# Patient Record
Sex: Female | Born: 1980 | Hispanic: No | Marital: Single | Smoking: Never smoker
Health system: Southern US, Community
[De-identification: ages and names within clinical notes are randomized; demographics above are authoritative.]

## PROBLEM LIST (undated history)

## (undated) DIAGNOSIS — Z8742 Personal history of other diseases of the female genital tract: Secondary | ICD-10-CM

## (undated) DIAGNOSIS — N979 Female infertility, unspecified: Secondary | ICD-10-CM

## (undated) DIAGNOSIS — F411 Generalized anxiety disorder: Secondary | ICD-10-CM

## (undated) DIAGNOSIS — F32A Depression, unspecified: Secondary | ICD-10-CM

## (undated) DIAGNOSIS — R9389 Abnormal findings on diagnostic imaging of other specified body structures: Secondary | ICD-10-CM

## (undated) DIAGNOSIS — Z8719 Personal history of other diseases of the digestive system: Secondary | ICD-10-CM

## (undated) DIAGNOSIS — F419 Anxiety disorder, unspecified: Secondary | ICD-10-CM

## (undated) DIAGNOSIS — F329 Major depressive disorder, single episode, unspecified: Secondary | ICD-10-CM

## (undated) DIAGNOSIS — K589 Irritable bowel syndrome without diarrhea: Secondary | ICD-10-CM

## (undated) DIAGNOSIS — R87619 Unspecified abnormal cytological findings in specimens from cervix uteri: Secondary | ICD-10-CM

## (undated) HISTORY — PX: AUGMENTATION MAMMAPLASTY: SUR837

## (undated) HISTORY — DX: Depression, unspecified: F32.A

## (undated) HISTORY — DX: Major depressive disorder, single episode, unspecified: F32.9

## (undated) HISTORY — PX: CYST EXCISION: SHX5701

## (undated) HISTORY — DX: Personal history of other diseases of the digestive system: Z87.19

## (undated) HISTORY — PX: REFRACTIVE SURGERY: SHX103

## (undated) HISTORY — PX: BREAST SURGERY: SHX581

## (undated) HISTORY — DX: Unspecified abnormal cytological findings in specimens from cervix uteri: R87.619

## (undated) HISTORY — DX: Anxiety disorder, unspecified: F41.9

## (undated) HISTORY — PX: LAPAROSCOPIC CHOLECYSTECTOMY: SUR755

---

## 1999-08-09 HISTORY — PX: LAPAROSCOPIC CHOLECYSTECTOMY: SUR755

## 2004-09-08 HISTORY — PX: CYST EXCISION: SHX5701

## 2006-09-08 HISTORY — PX: AUGMENTATION MAMMAPLASTY: SUR837

## 2008-09-08 HISTORY — PX: CYST EXCISION: SHX5701

## 2012-02-03 LAB — HM PAP SMEAR: HM Pap smear: NEGATIVE

## 2013-02-03 ENCOUNTER — Encounter: Payer: Self-pay | Admitting: *Deleted

## 2013-02-04 ENCOUNTER — Encounter: Payer: Self-pay | Admitting: Nurse Practitioner

## 2013-02-04 ENCOUNTER — Ambulatory Visit (INDEPENDENT_AMBULATORY_CARE_PROVIDER_SITE_OTHER): Payer: BC Managed Care – PPO | Admitting: Nurse Practitioner

## 2013-02-04 VITALS — BP 108/68 | HR 70 | Resp 16 | Ht 64.5 in | Wt 129.0 lb

## 2013-02-04 DIAGNOSIS — Z113 Encounter for screening for infections with a predominantly sexual mode of transmission: Secondary | ICD-10-CM

## 2013-02-04 DIAGNOSIS — Z0000 Encounter for general adult medical examination without abnormal findings: Secondary | ICD-10-CM

## 2013-02-04 DIAGNOSIS — Z01419 Encounter for gynecological examination (general) (routine) without abnormal findings: Secondary | ICD-10-CM

## 2013-02-04 LAB — POCT URINALYSIS DIPSTICK
Bilirubin, UA: NEGATIVE
Glucose, UA: NEGATIVE
Leukocytes, UA: NEGATIVE
Nitrite, UA: NEGATIVE

## 2013-02-04 NOTE — Progress Notes (Signed)
32 y.o. G1P1 Single Native American Fe here for annual exam.  Has Mirena IUD since 12/09. Usually no vaginal spotting.  Last week after SA had spotting for 1 day with wiping.  New partner for about 2 months. Wants to get STD's.   PCP gives Zoloft and she is  doing well.  Patient's last menstrual period was 01/28/2013.          Sexually active: yes  The current method of family planning is Mirena IUD inserted 08/2008 .  Exercising: yes  The patient does not participate in regular exercise at present. Smoker:  no  Health Maintenance: Pap:  02/03/12 normal MMG: n/a TDaP: 03/2009 Labs:  Hgb 12.5 ;normal urine   reports that she has never smoked. She has never used smokeless tobacco. She reports that  drinks alcohol. She reports that she does not use illicit drugs.  Past Medical History  Diagnosis Date  . Anxiety   . Depression     Past Surgical History  Procedure Laterality Date  . Breast surgery    . Augmentation mammaplasty      implants  . Refractive surgery    . Laparoscopic cholecystectomy    . Cyst excision      seb. cyst upper back   . Cesarean section  01/1999    Current Outpatient Prescriptions  Medication Sig Dispense Refill  . levonorgestrel (MIRENA) 20 MCG/24HR IUD 1 each by Intrauterine route once.      . sertraline (ZOLOFT) 100 MG tablet Take 100 mg by mouth daily.       No current facility-administered medications for this visit.    Family History  Problem Relation Age of Onset  . Cancer Father 29    Prostate   . Hypertension Maternal Grandmother     ROS:  Pertinent items are noted in HPI.  Otherwise, a comprehensive ROS was negative.  Exam:   BP 108/68  Pulse 70  Resp 16  Ht 5' 4.5" (1.638 m)  Wt 129 lb (58.514 kg)  BMI 21.81 kg/m2  LMP 01/28/2013 Height: 5' 4.5" (163.8 cm)  Ht Readings from Last 3 Encounters:  02/04/13 5' 4.5" (1.638 m)    General appearance: alert, cooperative and appears stated age Head: Normocephalic, without obvious  abnormality, atraumatic Neck: no adenopathy, supple, symmetrical, trachea midline and thyroid normal to inspection and palpation Lungs: clear to auscultation bilaterally Breasts: normal appearance, no masses or tenderness, positive findings: implants bilaterally Heart: regular rate and rhythm Abdomen: soft, non-tender; no masses,  no organomegaly Extremities: extremities normal, atraumatic, no cyanosis or edema Skin: Skin color, texture, turgor normal. No rashes or lesions Lymph nodes: Cervical, supraclavicular, and axillary nodes normal. No abnormal inguinal nodes palpated Neurologic: Grossly normal   Pelvic: External genitalia:  no lesions              Urethra:  normal appearing urethra with no masses, tenderness or lesions              Bartholin's and Skene's: normal                 Vagina: normal appearing vagina with normal color and discharge, no lesions              Cervix: anteverted              Pap taken: yes with GC and Chl Bimanual Exam:  Uterus:  normal size, contour, position, consistency, mobility, non-tender  Adnexa: no mass, fullness, tendernessNo pain               Rectovaginal: Confirms               Anus:  normal sphincter tone, no lesions  A:  Well Woman with normal exam  R/O STD  Mirena IUD for change later this year - she will call for apt.    P:   Pap smear as per guidelines   counseled on STD prevention, adequate intake of calcium and vitamin D,   diet and exercise return annually or prn  An After Visit Summary was printed and given to the patient.

## 2013-02-04 NOTE — Patient Instructions (Signed)

## 2013-02-05 NOTE — Progress Notes (Signed)
Reviewed personally. 

## 2013-02-07 ENCOUNTER — Telehealth: Payer: Self-pay | Admitting: *Deleted

## 2013-02-07 NOTE — Telephone Encounter (Signed)
Message copied by Osie Bond on Mon Feb 07, 2013  1:29 PM ------      Message from: Ria Comment R      Created: Mon Feb 07, 2013  8:28 AM       Let patient now all labs are normal ------

## 2013-02-07 NOTE — Telephone Encounter (Signed)
LVM for pt to return my call in regards to lab results.  

## 2013-02-08 ENCOUNTER — Telehealth: Payer: Self-pay | Admitting: *Deleted

## 2013-02-08 LAB — IPS PAP TEST WITH REFLEX TO HPV

## 2013-02-08 NOTE — Telephone Encounter (Signed)
Message copied by Osie Bond on Tue Feb 08, 2013 10:39 AM ------      Message from: Ria Comment R      Created: Mon Feb 07, 2013  8:28 AM       Let patient now all labs are normal ------

## 2013-02-08 NOTE — Telephone Encounter (Signed)
LVM for pt to return my call in regards to negative lab results.

## 2013-02-08 NOTE — Progress Notes (Signed)
LVM for pt to return my call in regards to negative lab results.  

## 2013-02-09 LAB — IPS N GONORRHOEA AND CHLAMYDIA BY PCR

## 2013-02-16 ENCOUNTER — Telehealth: Payer: Self-pay | Admitting: *Deleted

## 2013-02-16 NOTE — Telephone Encounter (Signed)
Pt is aware of negative lab results.

## 2013-02-16 NOTE — Telephone Encounter (Signed)
Message copied by Osie Bond on Wed Feb 16, 2013 12:51 PM ------      Message from: Ria Comment R      Created: Thu Feb 10, 2013  8:29 AM       Let patient know negative ------

## 2013-08-09 ENCOUNTER — Telehealth: Payer: Self-pay | Admitting: Nurse Practitioner

## 2013-08-09 DIAGNOSIS — Z30433 Encounter for removal and reinsertion of intrauterine contraceptive device: Secondary | ICD-10-CM

## 2013-08-09 NOTE — Telephone Encounter (Signed)
Please have pt use Cytotec pv night before and am of procedure.  I have not placed order as I don't want pharmacy calling her first.  Thanks.

## 2013-08-09 NOTE — Telephone Encounter (Signed)
I have scheduled patient for Mirena removal and replacement with Dr. Hyacinth Meeker, patient has very limited time window. Requests early AM appointment or Late afternoon appointment only. Mirena due out end of December. . Patient hesitant to have replacement as she states there were prior issues with insertion. Advised patient if has removal only will need to discuss birth control options with provider. Patient will think about if she wants Mirena replaced, but then states she would like to have it replaced. I advised we could precert for removal and replacement and scheduled for both removal and insertion.  Motrin instructions given.   Motrin=Advil=Ibuprofen  800 mg one hour before procedure. Eat lunch and hydrate well before appointment.   Patient agreeable.   [name] okay to order for removal and insertion of new iud?  Carolynn can you precert?

## 2013-08-09 NOTE — Telephone Encounter (Signed)
Patient needs to get mirana removed.

## 2013-08-09 NOTE — Telephone Encounter (Signed)
Yes OK for removal and insertion and then patient can decide - she may be a good candidate for Cytotec.  May need to check with Dr. Hyacinth Meeker.

## 2013-08-10 NOTE — Telephone Encounter (Signed)
LMTCB to discuss insurance benefits for IUD replacement. And to advise that we will need to collect balance at check-in.

## 2013-08-11 ENCOUNTER — Other Ambulatory Visit: Payer: Self-pay | Admitting: Nurse Practitioner

## 2013-08-11 MED ORDER — MISOPROSTOL 200 MCG PO TABS: ORAL_TABLET | ORAL | Status: DC

## 2013-08-11 NOTE — Telephone Encounter (Signed)
Spoke with patient. Advised of her cost on day of procedure, $70.00. She is agreeable.   Advised of need for cytotec and instructions given. Requested to be sent to [facility] on [address].

## 2013-08-12 NOTE — Telephone Encounter (Signed)
Pt called stating she needed RX for Metrogel.  Pt states this has been prescribed to use as needed.  Pt states she is having symptoms now.  Advised pt she will need appt before we can refill.  Appt scheduled with [name] on Monday.

## 2013-08-12 NOTE — Telephone Encounter (Signed)
Patient wants a refill for [facility] on [address]

## 2013-08-15 ENCOUNTER — Ambulatory Visit (INDEPENDENT_AMBULATORY_CARE_PROVIDER_SITE_OTHER): Payer: BC Managed Care – PPO | Admitting: Nurse Practitioner

## 2013-08-15 ENCOUNTER — Encounter: Payer: Self-pay | Admitting: Nurse Practitioner

## 2013-08-15 VITALS — BP 100/66 | HR 84 | Ht 64.5 in | Wt 129.0 lb

## 2013-08-15 DIAGNOSIS — N760 Acute vaginitis: Secondary | ICD-10-CM

## 2013-08-15 MED ORDER — METRONIDAZOLE 0.75 % VA GEL: 1.0000 | Freq: Every day | VAGINAL | Status: DC

## 2013-08-15 NOTE — Progress Notes (Signed)
Subjective:     Patient ID: Felicia Baxter, female   DOB: [birth year], 32 y.o.   MRN: [id number]  HPI  This 32 yo G1 P1 Single Native American Fe presents with vaginal discharge for several days.  She used some left over Metrogel X 1 day that seemed to help a little.  Discharge is yellow in color without itching.  She denies urinary symptoms.  Symptoms do seem to be associated with SA.   Same partner without symptoms.   Review of Systems  Constitutional: Negative for fever, chills and fatigue.  HENT: Negative.   Respiratory: Negative.   Cardiovascular: Negative.   Gastrointestinal: Negative.  Negative for nausea, vomiting, abdominal pain and diarrhea.  Genitourinary: Positive for vaginal discharge. Negative for dysuria, urgency, frequency, hematuria, flank pain, decreased urine volume, vaginal bleeding, genital sores, vaginal pain, menstrual problem and pelvic pain.       Mirena IUD for contraception.  Musculoskeletal: Negative.   Skin: Negative.   Neurological: Negative.   Psychiatric/Behavioral: Negative.        Objective:   Physical Exam  Constitutional: She is oriented to person, place, and time. She appears well-developed and well-nourished. No distress.  Abdominal: Soft. She exhibits no distension. There is no tenderness. There is no rebound and no guarding.  Genitourinary:  Thin clear to yellow tint vaginal discharge. No cervicitis. No pain in bimanual. Wet Prep:  Ph 5.5; NSS: + clue cells; KOH: negative.  Neurological: She is alert and oriented to person, place, and time.  Psychiatric: She has a normal mood and affect. Her behavior is normal. Judgment and thought content normal.       Assessment:     Bacterial vaginitis    Plan:     Refill Metrogel hs X 5, then will try 1/2 - 1 applicator after SA to see if helps with chronic BV symptoms. If symptoms worsens to call back

## 2013-08-15 NOTE — Patient Instructions (Signed)
Bacterial Vaginosis Bacterial vaginosis (BV) is a vaginal infection where the normal balance of bacteria in the vagina is disrupted. The normal balance is then replaced by an overgrowth of certain bacteria. There are several different kinds of bacteria that can cause BV. BV is the most common vaginal infection in women of childbearing age. CAUSES   The cause of BV is not fully understood. BV develops when there is an increase or imbalance of harmful bacteria.  Some activities or behaviors can upset the normal balance of bacteria in the vagina and put women at increased risk including:  Having a new sex partner or multiple sex partners.  Douching.  Using an intrauterine device (IUD) for contraception.  It is not clear what role sexual activity plays in the development of BV. However, women that have never had sexual intercourse are rarely infected with BV. Women do not get BV from toilet seats, bedding, swimming pools or from touching objects around them.  SYMPTOMS   Grey vaginal discharge.  A fish-like odor with discharge, especially after sexual intercourse.  Itching or burning of the vagina and vulva.  Burning or pain with urination.  Some women have no signs or symptoms at all. DIAGNOSIS  Your caregiver must examine the vagina for signs of BV. Your caregiver will perform lab tests and look at the sample of vaginal fluid through a microscope. They will look for bacteria and abnormal cells (clue cells), a pH test higher than 4.5, and a positive amine test all associated with BV.  RISKS AND COMPLICATIONS   Pelvic inflammatory disease (PID).  Infections following gynecology surgery.  Developing HIV.  Developing herpes virus. TREATMENT  Sometimes BV will clear up without treatment. However, all women with symptoms of BV should be treated to avoid complications, especially if gynecology surgery is planned. Female partners generally do not need to be treated. However, BV may spread  between female sex partners so treatment is helpful in preventing a recurrence of BV.   BV may be treated with antibiotics. The antibiotics come in either pill or vaginal cream forms. Either can be used with nonpregnant or pregnant women, but the recommended dosages differ. These antibiotics are not harmful to the baby.  BV can recur after treatment. If this happens, a second round of antibiotics will often be prescribed.  Treatment is important for pregnant women. If not treated, BV can cause a premature delivery, especially for a pregnant woman who had a premature birth in the past. All pregnant women who have symptoms of BV should be checked and treated.  For chronic reoccurrence of BV, treatment with a type of prescribed gel vaginally twice a week is helpful. HOME CARE INSTRUCTIONS   Finish all medication as directed by your caregiver.  Do not have sex until treatment is completed.  Tell your sexual partner that you have a vaginal infection. They should see their caregiver and be treated if they have problems, such as a mild rash or itching.  Practice safe sex. Use condoms. Only have 1 sex partner. PREVENTION  Basic prevention steps can help reduce the risk of upsetting the natural balance of bacteria in the vagina and developing BV:  Do not have sexual intercourse (be abstinent).  Do not douche.  Use all of the medicine prescribed for treatment of BV, even if the signs and symptoms go away.  Tell your sex partner if you have BV. That way, they can be treated, if needed, to prevent reoccurrence. SEEK MEDICAL CARE IF:     Your symptoms are not improving after 3 days of treatment.  You have increased discharge, pain, or fever. MAKE SURE YOU:   Understand these instructions.  Will watch your condition.  Will get help right away if you are not doing well or get worse. FOR MORE INFORMATION  Division of STD Prevention (DSTDP), Centers for Disease Control and Prevention:  www.cdc.gov/std American Social Health Association (ASHA): www.ashastd.org  Document Released: 08/25/2005 Document Revised: 11/17/2011 Document Reviewed: 04/06/2013 ExitCare Patient Information 2014 [employer].  

## 2013-08-18 NOTE — Progress Notes (Signed)
Reviewed personally.  M. Suzanne Loriene Taunton, MD.  

## 2013-08-22 ENCOUNTER — Ambulatory Visit (INDEPENDENT_AMBULATORY_CARE_PROVIDER_SITE_OTHER): Payer: BC Managed Care – PPO | Admitting: Obstetrics & Gynecology

## 2013-08-22 ENCOUNTER — Telehealth: Payer: Self-pay | Admitting: Obstetrics & Gynecology

## 2013-08-22 VITALS — BP 108/64 | HR 80 | Resp 20 | Ht 63.75 in | Wt 127.8 lb

## 2013-08-22 DIAGNOSIS — Z30433 Encounter for removal and reinsertion of intrauterine contraceptive device: Secondary | ICD-10-CM

## 2013-08-22 HISTORY — PX: INTRAUTERINE DEVICE INSERTION: SHX323

## 2013-08-22 NOTE — Telephone Encounter (Signed)
Patient seen miller today for iud insertion. Wants to see her back in 6 weeks only thing available was an 8:30 on the 30th patient couldn't do that needed later appt could you find one?

## 2013-08-22 NOTE — Progress Notes (Signed)
31 y.o. G1P1 Single Native American female presents for removal of and insertion of Mirena. She has been counseled about alternative forms of birth control including oral contraceptives, progesterone methods, IUD, barrier method, and sterilization.  She has decided to proceed with IUD placement.  Currently, she denies any vaginal symptoms or STD concerns.  LMP:  No LMP recorded. Patient is not currently having periods (Reason: IUD).  Healthy female,time, place and person Abdomen: soft, non-tender Groin: no inguinal nodes palpated  Pelvic exam: Vulva;normal Vagina:normal vagina Cervix:Non-tender, Negative CMT, no lesions or redness Uterus:normal shape, position and consistency   After patient read information booklet and all questions were answered, informed consent was obtained.    Procedure:  Speculum inserted into vagina. Cervix visualized and cleansed with betadine solution X 3. Paracervical block was placed.  1% Lidocaine was used.  10cc total.  IUD removed by pulling on strings.  IUD removed intact with one pull.  IUD was curved at base.  Shown to patient.  Tenaculum placed on cervix at 12 o'clock position.  Uterus sounded to 7 centimeters.  IUD removed from sterile packet and under sterile conditions inserted to fundus of uterus.  Introducer removed without difficulty.  IUD string trimmed to 2 centimeters.  Remainder string given to patient to feel for identification.  Tenaculum removed.  Minimal bleeding noted.  Speculum removed.  Uterus palpated normal.  Patient tolerated procedure well.  IUD Lot #:[id number].  Exp: 8/16.  Package information attached to consent and scanned into EPIC.  A: Removal of and Insertion of Mirena   P: Return to office 6 weeks for recheck Pt knows IUD needs to be replaced approximately 5 years

## 2013-08-22 NOTE — Patient Instructions (Signed)

## 2013-08-23 NOTE — Telephone Encounter (Signed)
Patient scheduled for IUD check 1/27 at 3:30,

## 2013-08-30 ENCOUNTER — Ambulatory Visit: Payer: BC Managed Care – PPO | Admitting: Obstetrics & Gynecology

## 2013-10-04 ENCOUNTER — Ambulatory Visit: Payer: BC Managed Care – PPO | Admitting: Obstetrics & Gynecology

## 2013-10-04 ENCOUNTER — Telehealth: Payer: Self-pay | Admitting: Obstetrics & Gynecology

## 2013-10-04 NOTE — Telephone Encounter (Signed)
Patient did not show for her IUD recheck today so I called the patient and left a message for her to call us back to reschedule.

## 2013-10-04 NOTE — Telephone Encounter (Signed)
Thank you. Encounter closed. 

## 2013-12-19 ENCOUNTER — Encounter: Payer: Self-pay | Admitting: Nurse Practitioner

## 2013-12-19 ENCOUNTER — Ambulatory Visit (INDEPENDENT_AMBULATORY_CARE_PROVIDER_SITE_OTHER): Payer: BC Managed Care – PPO | Admitting: Nurse Practitioner

## 2013-12-19 VITALS — BP 110/62 | HR 88 | Ht 64.5 in | Wt 132.0 lb

## 2013-12-19 DIAGNOSIS — N390 Urinary tract infection, site not specified: Secondary | ICD-10-CM

## 2013-12-19 DIAGNOSIS — R319 Hematuria, unspecified: Secondary | ICD-10-CM

## 2013-12-19 LAB — POCT URINALYSIS DIPSTICK
Bilirubin, UA: NEGATIVE
Glucose, UA: NEGATIVE
Ketones, UA: NEGATIVE
Nitrite, UA: NEGATIVE
Urobilinogen, UA: NEGATIVE
pH, UA: 6

## 2013-12-19 MED ORDER — NITROFURANTOIN MONOHYD MACRO 100 MG PO CAPS: 100.0000 mg | ORAL_CAPSULE | Freq: Two times a day (BID) | ORAL | Status: DC

## 2013-12-19 MED ORDER — PHENAZOPYRIDINE HCL 200 MG PO TABS: 200.0000 mg | ORAL_TABLET | Freq: Three times a day (TID) | ORAL | Status: DC | PRN

## 2013-12-19 NOTE — Patient Instructions (Signed)
Asymptomatic Bacteriuria, Female Asymptomatic bacteriuria is a significant number of bacteria in your urine that occur without the usual symptoms of burning or frequent urination. The following conditions increase risk of asymptomatic bacteriuria:  Diabetes mellitus.  Advanced age.  Pregnancy in the first trimester.  Kidney stones.  Kidney transplants.  Leaky kidney tube valve in young children (reflux). Treatment for asymptomatic bacteriuria is not required in most people and can lead to other problems such as yeast overgrowth and development of resistant bacteria. However, some people, such as pregnant women, do need treatment to prevent kidney infection. Asymptomatic bacteriuria in pregnancy is also associated with fetal growth restriction, premature labor, and newborn death. HOME CARE INSTRUCTIONS Monitor your bacteriuria for any changes. The following actions may help to alleviate any discomfort you are experiencing:  Drink enough water and fluids to keep your urine clear or pale yellow. Go to the bathroom more frequently to keep your bladder empty.  Keep the area around your vagina and rectum clean. Wipe yourself from front to back after urinating. SEEK IMMEDIATE MEDICAL CARE IF:  You develop signs of an infection such asburning with urination, frequency of voiding, back pain, or fever.  You have blood in the urine.  You develop a fever. Document Released: 08/25/2005 Document Revised: 04/27/2013 Document Reviewed: 02/14/2013 ExitCare Patient Information 2014 [employer].  

## 2013-12-19 NOTE — Progress Notes (Deleted)
Subjective:     Patient ID: Felicia Baxter, female   DOB: [birth year], 33 y.o.   MRN: [id number]  HPI UTI symptoms saince SAturday Pm. Symptoms after A.  No fever or chills.  No NV   Review of Systems     Objective:   Physical Exam     Assessment:     ***    Plan:     ***

## 2013-12-19 NOTE — Progress Notes (Signed)
S: 33 y.o.Single Native American female presents with complaint of UTI. Symptoms began on  Saturday.  Symptoms of dysuria, urinary frequency, urinary urgencyPertinent negatives include non nausea and vomiting or abdominal pain. Sexually active yes  Symptoms are  related to post coital - Yes. Current method of birth control *Mirena.  Vaginal dryness no.  Same partner without change. Last UTI documented last year.  ROS:   No weight loss, fever, night sweats and feels well  O alert, oriented to person, place, and time, normal mood, behavior, speech, dress, motor activity, and thought processes   healthy,  alert,  not in acute distress, well developed and well nourished  without guarding, no flank pain  No CVA tenderness, no flank pain  deferred   Diagnostic Test:    Urinalysis WBC: 1 +, RBC- large, trace of protein    Assessment:   Medications: Macrobid. Maintain adequate hydration. Follow up if symptoms not improving, and as needed. fever or chills   Medication Therapy:  Macrobid 100 mg BID for a week   Lab: Urine Culture and micro - will follow   TOC if Urine Culture is positive   Plan: When she returns if Macrobid worked for her will give her RX for Macrobid to use post coital.     RV

## 2013-12-20 LAB — URINE CULTURE
Colony Count: NO GROWTH
Organism ID, Bacteria: NO GROWTH

## 2013-12-20 LAB — URINALYSIS, MICROSCOPIC ONLY
Bacteria, UA: NONE SEEN
CASTS: NONE SEEN
Crystals: NONE SEEN

## 2013-12-20 NOTE — Progress Notes (Signed)
Encounter reviewed by Dr. Brook Silva.  

## 2013-12-21 ENCOUNTER — Other Ambulatory Visit: Payer: Self-pay | Admitting: Nurse Practitioner

## 2013-12-21 ENCOUNTER — Telehealth: Payer: Self-pay | Admitting: Nurse Practitioner

## 2013-12-21 MED ORDER — CIPROFLOXACIN HCL 500 MG PO TABS: 500.0000 mg | ORAL_TABLET | Freq: Two times a day (BID) | ORAL | Status: DC

## 2013-12-21 NOTE — Telephone Encounter (Signed)
Message copied by FASMarc Morgans, Adeel Guiffre L on Wed Dec 21, 2013  2:39 PM ------      Message from: GRUBB, PATRICIA R      Created: Wed Dec 21, 2013  8:45 AM       Not sure why her urine culture is negative when her micro shows WBC and RBC.  Have her to continue med's and come in for a 2 week recheck urine culture - nurse visit.  If at recheck visit and all symptoms are gone will give her Macrobid to use post coital. ------

## 2013-12-21 NOTE — Telephone Encounter (Signed)
Spoke with patient. Message from Lauro FranklinPatricia Rolen-Grubb, FNP given. Patient states she feels "Just the same that I did the other day". Patient states she is now having back pain. States she knows that the pyridium would change her urine color but that she feels that she is still having blood in urine and can see blood. Patient denies fevers, nausea, and vomiting. Advised would discuss with Lauro FranklinPatricia Rolen-Grubb, FNP and return call. Patient is on her way to a conference at this time, unable to come in for office visit. Advise would return her call.   1547: Spoke with [name] and obtained order for Cipro and and will take one dose now, one dose tonight and then one dose in the morning and then follow up with an appointment with Lauro FranklinPatricia Rolen-Grubb, FNP as scheduled at 0915. Message left to return call to [place] at [phone].  Left instructions on voice mail as patient states she was going into a conference and would not be done until later this afternoon.  Okay to leave medical information on voicemail per release of information on file.   Routing to provider for final review. Patient agreeable to disposition. Will close encounter

## 2013-12-21 NOTE — Telephone Encounter (Signed)
Patient calling for results from yesterday's visit. She says she was told one of the tests would be back today. Also, patient states her symptoms have not gotten any better and wants to know if there is anything else she can do?

## 2013-12-22 ENCOUNTER — Encounter: Payer: Self-pay | Admitting: Nurse Practitioner

## 2013-12-22 ENCOUNTER — Ambulatory Visit (INDEPENDENT_AMBULATORY_CARE_PROVIDER_SITE_OTHER): Payer: BC Managed Care – PPO | Admitting: Nurse Practitioner

## 2013-12-22 VITALS — BP 100/62 | HR 92 | Ht 64.5 in | Wt 132.0 lb

## 2013-12-22 DIAGNOSIS — N390 Urinary tract infection, site not specified: Secondary | ICD-10-CM

## 2013-12-22 LAB — CBC WITH DIFFERENTIAL/PLATELET
Basophils Absolute: 0 10*3/uL (ref 0.0–0.1)
Eosinophils Absolute: 0 10*3/uL (ref 0.0–0.7)
HEMATOCRIT: 39.7 % (ref 36.0–46.0)
Hemoglobin: 12.8 g/dL (ref 12.0–15.0)
LYMPHS PCT: 29 % (ref 12–46)
Lymphs Abs: 1.9 10*3/uL (ref 0.7–4.0)
MCH: 27.2 pg (ref 26.0–34.0)
MCHC: 32.4 g/dL (ref 30.0–36.0)
MCV: 84.4 fL (ref 78.0–100.0)
MONO ABS: 0.4 10*3/uL (ref 0.1–1.0)
Monocytes Relative: 6 % (ref 3–12)
Neutro Abs: 4.4 10*3/uL (ref 1.7–7.7)
Neutrophils Relative %: 65 % (ref 43–77)
Platelets: 197 10*3/uL (ref 150–400)
RBC: 4.7 MIL/uL (ref 3.87–5.11)
RDW: 12 % (ref 11.5–15.5)
WBC: 6.7 10*3/uL (ref 4.0–10.5)

## 2013-12-22 NOTE — Patient Instructions (Signed)
Urinary Tract Infection  Urinary tract infections (UTIs) can develop anywhere along your urinary tract. Your urinary tract is your body's drainage system for removing wastes and extra water. Your urinary tract includes two kidneys, two ureters, a bladder, and a urethra. Your kidneys are a pair of bean-shaped organs. Each kidney is about the size of your fist. They are located below your ribs, one on each side of your spine.  CAUSES  Infections are caused by microbes, which are microscopic organisms, including fungi, viruses, and bacteria. These organisms are so small that they can only be seen through a microscope. Bacteria are the microbes that most commonly cause UTIs.  SYMPTOMS   Symptoms of UTIs may vary by age and gender of the patient and by the location of the infection. Symptoms in young women typically include a frequent and intense urge to urinate and a painful, burning feeling in the bladder or urethra during urination. Older women and men are more likely to be tired, shaky, and weak and have muscle aches and abdominal pain. A fever may mean the infection is in your kidneys. Other symptoms of a kidney infection include pain in your back or sides below the ribs, nausea, and vomiting.  DIAGNOSIS  To diagnose a UTI, your caregiver will ask you about your symptoms. Your caregiver also will ask to provide a urine sample. The urine sample will be tested for bacteria and white blood cells. White blood cells are made by your body to help fight infection.  TREATMENT   Typically, UTIs can be treated with medication. Because most UTIs are caused by a bacterial infection, they usually can be treated with the use of antibiotics. The choice of antibiotic and length of treatment depend on your symptoms and the type of bacteria causing your infection.  HOME CARE INSTRUCTIONS   If you were prescribed antibiotics, take them exactly as your caregiver instructs you. Finish the medication even if you feel better after you  have only taken some of the medication.   Drink enough water and fluids to keep your urine clear or pale yellow.   Avoid caffeine, tea, and carbonated beverages. They tend to irritate your bladder.   Empty your bladder often. Avoid holding urine for long periods of time.   Empty your bladder before and after sexual intercourse.   After a bowel movement, women should cleanse from front to back. Use each tissue only once.  SEEK MEDICAL CARE IF:    You have back pain.   You develop a fever.   Your symptoms do not begin to resolve within 3 days.  SEEK IMMEDIATE MEDICAL CARE IF:    You have severe back pain or lower abdominal pain.   You develop chills.   You have nausea or vomiting.   You have continued burning or discomfort with urination.  MAKE SURE YOU:    Understand these instructions.   Will watch your condition.   Will get help right away if you are not doing well or get worse.  Document Released: 06/04/2005 Document Revised: 02/24/2012 Document Reviewed: 10/03/2011  ExitCare Patient Information 2014 [employer].

## 2013-12-22 NOTE — Telephone Encounter (Signed)
Patient is informed that STAT CBC was normal.  If she continues with pain she may need to seek ER care for possible renal calculi. She is aware.

## 2013-12-22 NOTE — Progress Notes (Deleted)
Subjective:     Patient ID: Felicia Baxter, female   DOB: [birth year], 33 y.o.   MRN: [id number]  HPI  This 33 yo  Felicia Baxter  12/19/2013 3:30 PM   Office Visit  MRN:  [id number]   Description: 33 year old female  Provider: Lauro FranklinPatricia Rolen-Ferris Tally, FNP  [facility]         Guarantor Account: Felicia Baxter, Felicia Baxter ([id number])      Relation to Patient: Account Type Service Area      Self Personal/Family [facility]              Coverages for This Account     Coverage ID Payor Plan Insurance ID      [phone] [id number] [facility] [place] [place] [place] [id number]              Guarantor Account: Felicia Baxter, Felicia Baxter ([id number])      Relation to Patient: Account Type Service Area      Self Personal/Family [facility]                   Diagnoses      Hematuria    -  Primary      599.70      UTI (urinary tract infection)          599.0             Reason for Visit      Urinary Tract Infection      nocturia, feels like has to urinate, hematuria            Reason For Visit History Recorded             Current Vitals Most recent update: 12/19/2013  3:51 PM by Luisa DagoStephanie C Phillips, CMA      BP Pulse Ht Wt BMI        110/62 88 5' 4.5" (1.638 m) 132 lb (59.875 kg) 22.32 kg/m2                Progress Notes      Lauro FranklinPatricia Rolen-Rhondalyn Clingan, FNP at 12/19/2013  5:09 PM      Status: Signed            S: 33 y.o.Single Native American female presents with complaint of UTI. Symptoms began on  Saturday.  Symptoms of dysuria, urinary frequency, urinary urgencyPertinent negatives include non nausea and vomiting or abdominal pain. Sexually active yes  Symptoms are  related to post coital - Yes. Current method of birth control *Mirena.  Vaginal dryness no.  Same partner without change. Last UTI documented last year.   ROS:   No weight loss, fever, night sweats and feels well   O         alert, oriented to person, place, and  time, normal mood, behavior, speech, dress, motor activity, and thought processes             healthy,  alert,  not in acute distress, well developed and well nourished             without guarding, no flank pain             No CVA tenderness, no flank pain             deferred    Diagnostic Test:               Urinalysis WBC: 1 +, RBC- large, trace of protein  Assessment:               Medications: Macrobid. Maintain adequate hydration. Follow up if symptoms not improving, and as needed. fever or chills                         Medication Therapy:  Macrobid 100 mg BID for a week                         Lab: Urine Culture and micro - will follow                         TOC if Urine Culture is positive     Plan:   When she returns if Macrobid worked for her will give her RX for Macrobid to use post coital.         RV                   Brook E Amundson de Gwenevere Ghaziarvalho E Silva, MD at 12/20/2013  6:53 PM      Status: Signed            Encounter reviewed by Dr. Conley SimmondsBrook Silva.               Encounter-Level Documents:      Electronic signature on 12/19/2013 3:36 PM           Not recorded       Medications Ordered This Encounter        Disp Refills Start End      nitrofurantoin, macrocrystal-monohydrate, (MACROBID) 100 MG capsule (Discontinued) 14 capsule 0 12/19/2013 12/21/2013      Take 1 capsule (100 mg total) by mouth 2 (two) times daily. - Oral      Reason for Discontinue: Change in therapy      phenazopyridine (PYRIDIUM) 200 MG tablet 20 tablet 0 12/19/2013        Take 1 tablet (200 mg total) by mouth 3 (three) times daily as needed for pain. - Oral              Discontinued Medications        Reason for Discontinue      buPROPion (WELLBUTRIN XL) 150 MG 24 hr tablet Change in therapy      metroNIDAZOLE (METROGEL) 0.75 % vaginal gel Completed Course              Orders Placed This Encounter      POCT urinalysis dipstick [POC5 Custom]       Urine culture [[id number][LAB239 Custom]      Urine Microscopic [[id number][LAB9024 Custom]            Results are available for this encounter                        Referring Provider      Cala Bradfordynthia S White, MD            All Charges for This Encounter      Code Description Service Date Service Provider Modifiers Qty      [phone] PR OFFICE OUTPATIENT VISIT 15 MINUTES 12/19/2013 Lauro FranklinPatricia Rolen-Malick Netz, FNP   1      81002 CHG URINALYSIS NONAUTO W/O SCOPE 12/19/2013 Lauro FranklinPatricia Rolen-Akshita Italiano, FNP   1               Other Encounter Related Information  Allergies & Medications             Problem List             History             Patient-Entered Questionnaires       AVS Reports      Date/Time Report Action User      12/19/2013  4:38 PM After Visit Summary Printed Charlyn Minerva             Smoking Cessation Audit Trail             Diabetic Foot Exam      No data filed           Diabetic Foot Form - Detailed      No data filed           Diabetic Foot Exam - Simple      No data filed                Some increase in right flank pain and new RX for Cipro ordered yesterday.  Will strt on meds today after this vist and culture sent.   Review of Systems     Objective:   Physical Exam     Assessment:     ***    Plan:     ***

## 2013-12-22 NOTE — Progress Notes (Signed)
Patient ID: Felicia Baxter, female   DOB: 12/06/1980, 33 y.o.   MRN: [id number] S This 33 y.o.Single Native American female presents with complaint of UTI.   She was seen 3 days ago and started on Macrobid.  She did not feel better and had an increase in right back pain.  She has had no fever or chills.  Her urine culture was negative despite her chemstrip here showing  WBC 1 + & RBC large.            Symptoms began on Saturday with dysuria, urinary frequency, urinary urgency.  Pertinent negatives no nausea and vomiting or abdominal pain. Sexually active yes Symptoms are related to post coital - Yes. Current method of birth control Mirena IUD.   O: Abdomen:  Soft and slight pressure suprapubic.  There is right flank pain but very mild.  Assessment: persistent UTI symptoms  R/O pyelonephritis  R/O renal calculi  Plan: Antibiotics have already been changed to Cipro but not yet started  Will repeat urine culture  Stat CBC and follow

## 2013-12-23 LAB — URINE CULTURE
COLONY COUNT: NO GROWTH
ORGANISM ID, BACTERIA: NO GROWTH

## 2013-12-23 LAB — URINALYSIS, MICROSCOPIC ONLY
Bacteria, UA: NONE SEEN
CASTS: NONE SEEN
Crystals: NONE SEEN
SQUAMOUS EPITHELIAL / LPF: NONE SEEN

## 2013-12-26 NOTE — Progress Notes (Signed)
Encounter reviewed by Dr. Brook Silva.  

## 2014-06-29 ENCOUNTER — Other Ambulatory Visit: Payer: Self-pay | Admitting: Nurse Practitioner

## 2014-06-29 ENCOUNTER — Telehealth: Payer: Self-pay | Admitting: Nurse Practitioner

## 2014-06-29 MED ORDER — METRONIDAZOLE 0.75 % VA GEL: VAGINAL | Status: DC

## 2014-06-29 NOTE — Telephone Encounter (Signed)
Spoke with patient. Patient states that she uses metrogel after intercourse and is out of refills. "Felicia FranklinPatricia Rolen-Grubb, FNP told me to use this every time after sex because I have recurring problems. Last time she gave me 4 refills but I am out now. I need her to send some more in for me." Advised patient would send a message over to Felicia FranklinPatricia Rolen-Grubb, FNP with request and return phone call with further information and recommendations. Patient is agreeable. Notes from OV on 08/2013 seen below. Patient was last given metrogel with 1 refill on 08/15/13.  Plan:   Refill Metrogel hs X 5, then will try 1/2 - 1 applicator after SA to see if helps with chronic BV symptoms.  If symptoms worsens to call back    Felicia FranklinPatricia Rolen-Grubb, FNP please advise.

## 2014-06-29 NOTE — Telephone Encounter (Signed)
Pt wants to talk with the nurse no information given. °

## 2014-06-29 NOTE — Telephone Encounter (Signed)
OK to refill and I will place the order.

## 2014-06-30 NOTE — Telephone Encounter (Signed)
Spoke with patient. Advised rx has been sent to pharmacy on file. Patient is agreeable and verbalizes understanding.  Routing to provider for final review. Patient agreeable to disposition. Will close encounter

## 2014-07-10 ENCOUNTER — Encounter: Payer: Self-pay | Admitting: Nurse Practitioner

## 2014-09-21 ENCOUNTER — Ambulatory Visit (INDEPENDENT_AMBULATORY_CARE_PROVIDER_SITE_OTHER): Payer: BC Managed Care – PPO | Admitting: Nurse Practitioner

## 2014-09-21 ENCOUNTER — Encounter: Payer: Self-pay | Admitting: Nurse Practitioner

## 2014-09-21 VITALS — BP 108/64 | HR 104 | Ht 64.0 in | Wt 137.0 lb

## 2014-09-21 DIAGNOSIS — Z01419 Encounter for gynecological examination (general) (routine) without abnormal findings: Secondary | ICD-10-CM

## 2014-09-21 DIAGNOSIS — Z113 Encounter for screening for infections with a predominantly sexual mode of transmission: Secondary | ICD-10-CM

## 2014-09-21 DIAGNOSIS — Z0000 Encounter for general adult medical examination without abnormal findings: Secondary | ICD-10-CM

## 2014-09-21 LAB — POCT URINALYSIS DIPSTICK
BILIRUBIN UA: NEGATIVE
Glucose, UA: NEGATIVE
Ketones, UA: NEGATIVE
Leukocytes, UA: NEGATIVE
NITRITE UA: NEGATIVE
PH UA: 6.5
Protein, UA: NEGATIVE
RBC UA: NEGATIVE
Urobilinogen, UA: NEGATIVE

## 2014-09-21 NOTE — Patient Instructions (Signed)

## 2014-09-21 NOTE — Progress Notes (Signed)
Patient ID: Felicia Baxter, female   DOB: 1981-04-12, 34 y.o.   MRN: [id number] 34 y.o. G1P1 Single  Native American Fe here for annual exam.  No menses with Mirena IUD. Doing well. Still on Cymbalta since end of last summer and doing well.  Followed PCP for that med's.  Using Metrogel only prn. usually after SA.   Same partner for 9 months.  Patient's last menstrual period was 01/28/2013 (approximate).  Sexually active: yes  The current method of family planning is Mirena IUD inserted 08/22/13.  Exercising: yes The patient does not participate in regular exercise at present. Smoker: no  Health Maintenance: Pap: 02/04/13, negative  History of abnormal with neg colpo in ~2002, ~2006 TDaP: 03/2009 Labs:  HB:  12.5   Urine:  Negative    reports that she has never smoked. She has never used smokeless tobacco. She reports that she drinks alcohol. She reports that she does not use illicit drugs.  Past Medical History  Diagnosis Date  . Anxiety   . Depression     Past Surgical History  Procedure Laterality Date  . Breast surgery    . Augmentation mammaplasty      implants  . Refractive surgery    . Laparoscopic cholecystectomy    . Cyst excision  2006    seb. cyst upper back   . Cesarean section  01/1999  . Cyst excision Left 2010    left buttock  . Intrauterine device insertion  08/2008    [place]    Current Outpatient Prescriptions  Medication Sig Dispense Refill  . DULoxetine (CYMBALTA) 60 MG capsule Take 1 capsule by mouth daily.    Marland Kitchen. levonorgestrel (MIRENA) 20 MCG/24HR IUD 1 each by Intrauterine route once.    . metroNIDAZOLE (METROGEL) 0.75 % vaginal gel Use as directed 70 g 1   No current facility-administered medications for this visit.    Family History  Problem Relation Age of Onset  . Cancer Father 4058    Prostate   . Hypertension Maternal Grandmother   . Cancer Maternal Grandfather     ROS:  Pertinent items are noted in HPI.   Otherwise, a comprehensive ROS was negative.  Exam:   BP 108/64 mmHg  Pulse 104  Ht 5\' 4"  (1.626 m)  Wt 137 lb (62.143 kg)  BMI 23.50 kg/m2  LMP 01/28/2013 (Approximate) Height: 5\' 4"  (162.6 cm) Ht Readings from Last 3 Encounters:  09/21/14 5\' 4"  (1.626 m)  12/22/13 5' 4.5" (1.638 m)  12/19/13 5' 4.5" (1.638 m)    General appearance: alert, cooperative and appears stated age Head: Normocephalic, without obvious abnormality, atraumatic Neck: no adenopathy, supple, symmetrical, trachea midline and thyroid normal to inspection and palpation Lungs: clear to auscultation bilaterally Breasts: normal appearance, no masses or tenderness Heart: regular rate and rhythm Abdomen: soft, non-tender; no masses,  no organomegaly Extremities: extremities normal, atraumatic, no cyanosis or edema Skin: Skin color, texture, turgor normal. No rashes or lesions Lymph nodes: Cervical, supraclavicular, and axillary nodes normal. No abnormal inguinal nodes palpated Neurologic: Grossly normal   Pelvic: External genitalia:  no lesions              Urethra:  normal appearing urethra with no masses, tenderness or lesions              Bartholin's and Skene's: normal                 Vagina: normal appearing vagina with normal color  and discharge, no lesions              Cervix: anteverted, IUD strings were visible.              Pap taken: Yes.   Bimanual Exam:  Uterus:  normal size, contour, position, consistency, mobility, non-tender              Adnexa: no mass, fullness, tenderness               Rectovaginal: Confirms               Anus:  normal sphincter tone, no lesions  Chaperone present:  no  A:  Well Woman with normal exam  R/O STD Mirena IUD changed 08/22/13   P:   Reviewed health and wellness pertinent to exam  Pap smear taken today  Follow with STD's  Counseled on breast self exam, STD prevention, adequate intake of calcium and vitamin D, diet and exercise return annually  or prn  An After Visit Summary was printed and given to the patient.

## 2014-09-22 LAB — STD PANEL
HEP B S AG: NEGATIVE
HIV: NONREACTIVE

## 2014-09-22 LAB — HEMOGLOBIN, FINGERSTICK: Hemoglobin, fingerstick: 12.5 g/dL (ref 12.0–16.0)

## 2014-09-23 LAB — IPS N GONORRHOEA AND CHLAMYDIA BY PCR

## 2014-09-24 NOTE — Progress Notes (Signed)
Encounter reviewed by Dr. Brook Silva.  

## 2014-09-25 LAB — IPS PAP TEST WITH HPV

## 2015-05-01 ENCOUNTER — Ambulatory Visit
Admission: RE | Admit: 2015-05-01 | Discharge: 2015-05-01 | Disposition: A | Discharge status: Home / Self Care | Payer: BC Managed Care – PPO | Source: Ambulatory Visit | Attending: Family Medicine | Admitting: Family Medicine

## 2015-05-01 ENCOUNTER — Other Ambulatory Visit: Payer: Self-pay | Admitting: Family Medicine

## 2015-05-01 DIAGNOSIS — K5901 Slow transit constipation: Secondary | ICD-10-CM

## 2015-09-25 ENCOUNTER — Encounter: Payer: Self-pay | Admitting: Nurse Practitioner

## 2015-09-25 ENCOUNTER — Ambulatory Visit (INDEPENDENT_AMBULATORY_CARE_PROVIDER_SITE_OTHER): Payer: BC Managed Care – PPO | Admitting: Nurse Practitioner

## 2015-09-25 VITALS — BP 112/76 | HR 96 | Ht 63.5 in | Wt 135.0 lb

## 2015-09-25 DIAGNOSIS — Z0000 Encounter for general adult medical examination without abnormal findings: Secondary | ICD-10-CM | POA: Diagnosis not present

## 2015-09-25 DIAGNOSIS — Z113 Encounter for screening for infections with a predominantly sexual mode of transmission: Secondary | ICD-10-CM | POA: Diagnosis not present

## 2015-09-25 DIAGNOSIS — N898 Other specified noninflammatory disorders of vagina: Secondary | ICD-10-CM

## 2015-09-25 DIAGNOSIS — Z01419 Encounter for gynecological examination (general) (routine) without abnormal findings: Secondary | ICD-10-CM | POA: Diagnosis not present

## 2015-09-25 LAB — POCT URINALYSIS DIPSTICK
Bilirubin, UA: NEGATIVE
Glucose, UA: NEGATIVE
Ketones, UA: NEGATIVE
Leukocytes, UA: NEGATIVE
Nitrite, UA: NEGATIVE
PROTEIN UA: NEGATIVE
RBC UA: NEGATIVE
UROBILINOGEN UA: NEGATIVE
pH, UA: 5

## 2015-09-25 MED ORDER — METRONIDAZOLE 0.75 % VA GEL: VAGINAL | Status: DC

## 2015-09-25 NOTE — Patient Instructions (Signed)

## 2015-09-25 NOTE — Progress Notes (Signed)
Patient ID: Felicia Baxter, female   DOB: 04-15-1981, 35 y.o.   MRN: [id number]  35 y.o. G1P0001 Single  Native American Fe here for annual exam.  No new health problems except for recent episode of constipation.  She has tried many OTC and then saw GI and given Linzess.  Now just takes prn.  Menses none since Mirena IUD.  No PMS symptoms.  Not dating or SA in 3 months.  Desires STD's.  No LMP recorded. Patient is not currently having periods (Reason: IUD).          Sexually active: Yes.    The current method of family planning is IUD.  Mirena inserted 08/22/13. Exercising: No.  The patient does not participate in regular exercise at present. Smoker:  no  Health Maintenance: Pap:09/21/14, negative with neg HR HPV History of abnormal with neg colpo in ~2002, ~2006 TDaP: 03/2009 HIV completed 09/21/14 Labs: HB: 12.7  Urine:  Negative    reports that she has never smoked. She has never used smokeless tobacco. She reports that she drinks alcohol. She reports that she does not use illicit drugs.  Past Medical History  Diagnosis Date  . Anxiety   . Depression     Past Surgical History  Procedure Laterality Date  . Breast surgery    . Augmentation mammaplasty      implants  . Refractive surgery    . Laparoscopic cholecystectomy    . Cyst excision  2006    seb. cyst upper back   . Cesarean section  01/1999  . Cyst excision Left 2010    left buttock  . Intrauterine device insertion  08/22/13    Mirena, first Mirena inserted 08/2008    Current Outpatient Prescriptions  Medication Sig Dispense Refill  . DULoxetine (CYMBALTA) 60 MG capsule Take 1 capsule by mouth daily.    Marland Kitchen levonorgestrel (MIRENA) 20 MCG/24HR IUD 1 each by Intrauterine route once.    Marland Kitchen LINZESS 145 MCG CAPS capsule Take 1 capsule by mouth daily.    . metroNIDAZOLE (METROGEL) 0.75 % vaginal gel Use as directed 70 g 3   No current facility-administered medications for this visit.    Family History  Problem Relation  Age of Onset  . Cancer Father 47    Prostate   . Hypertension Maternal Grandmother   . Cancer Maternal Grandfather     ROS:  Pertinent items are noted in HPI.  Otherwise, a comprehensive ROS was negative.  Exam:   BP 112/76 mmHg  Pulse 96  Ht 5' 3.5" (1.613 m)  Wt 135 lb (61.236 kg)  BMI 23.54 kg/m2 Height: 5' 3.5" (161.3 cm) Ht Readings from Last 3 Encounters:  09/25/15 5' 3.5" (1.613 m)  09/21/14  (1.626 m)  12/22/13 5' 4.5" (1.638 m)    General appearance: alert, cooperative and appears stated age Head: Normocephalic, without obvious abnormality, atraumatic Neck: no adenopathy, supple, symmetrical, trachea midline and thyroid normal to inspection and palpation Lungs: clear to auscultation bilaterally Breasts: normal appearance, no masses or tenderness Heart: regular rate and rhythm Abdomen: soft, non-tender; no masses,  no organomegaly Extremities: extremities normal, atraumatic, no cyanosis or edema Skin: Skin color, texture, turgor normal. No rashes or lesions Lymph nodes: Cervical, supraclavicular, and axillary nodes normal. No abnormal inguinal nodes palpated Neurologic: Grossly normal   Pelvic: External genitalia:  no lesions, slight irritation externally              Urethra:  normal appearing urethra with no masses,  tenderness or lesions              Bartholin's and Skene's: normal                 Vagina: normal appearing vagina with normal color and thin white discharge, no lesions              Cervix: anteverted, IUD string is visible              Pap taken: No. Bimanual Exam:  Uterus:  normal size, contour, position, consistency, mobility, non-tender              Adnexa: no mass, fullness, tenderness               Rectovaginal: Confirms               Anus:  normal sphincter tone, no lesions  Chaperone present: yes  A:  Well Woman with normal exam  R/O STD's Mirena IUD changed 08/22/13  R/O vaginitis  New history of constipation -  better with Linzess   P:   Reviewed health and wellness pertinent to exam  Pap smear as above  Refill on Metrogel to use prn after SA  Will follow with Affirm  Counseled on breast self exam, use and side effects of OCP's, adequate intake of calcium and vitamin D, diet and exercise return annually or prn  An After Visit Summary was printed and given to the patient.

## 2015-09-25 NOTE — Progress Notes (Signed)
Encounter reviewed by Dr. Wilder Amodei Amundson C. Silva.  

## 2015-09-26 ENCOUNTER — Other Ambulatory Visit: Payer: Self-pay | Admitting: Nurse Practitioner

## 2015-09-26 LAB — STD PANEL
HEP B S AG: NEGATIVE
HIV 1&2 Ab, 4th Generation: NONREACTIVE

## 2015-09-26 LAB — WET PREP BY MOLECULAR PROBE
Candida species: POSITIVE — AB
GARDNERELLA VAGINALIS: NEGATIVE
TRICHOMONAS VAG: NEGATIVE

## 2015-09-26 MED ORDER — FLUCONAZOLE 150 MG PO TABS: 150.0000 mg | ORAL_TABLET | Freq: Once | ORAL | Status: DC

## 2015-09-27 LAB — IPS N GONORRHOEA AND CHLAMYDIA BY PCR

## 2015-10-01 LAB — HEMOGLOBIN, FINGERSTICK: Hemoglobin, fingerstick: 12.7 g/dL (ref 12.0–16.0)

## 2016-06-24 ENCOUNTER — Encounter: Payer: Self-pay | Admitting: Nurse Practitioner

## 2016-06-24 ENCOUNTER — Ambulatory Visit (INDEPENDENT_AMBULATORY_CARE_PROVIDER_SITE_OTHER): Payer: BC Managed Care – PPO | Admitting: Nurse Practitioner

## 2016-06-24 VITALS — BP 108/66 | HR 64 | Temp 98.6°F | Ht 63.5 in | Wt 145.0 lb

## 2016-06-24 DIAGNOSIS — R3915 Urgency of urination: Secondary | ICD-10-CM

## 2016-06-24 DIAGNOSIS — R350 Frequency of micturition: Secondary | ICD-10-CM | POA: Diagnosis not present

## 2016-06-24 LAB — POCT URINALYSIS DIPSTICK
Bilirubin, UA: NEGATIVE
GLUCOSE UA: NEGATIVE
KETONES UA: NEGATIVE
NITRITE UA: NEGATIVE
PH UA: 7
Protein, UA: NEGATIVE
RBC UA: NEGATIVE
Urobilinogen, UA: NEGATIVE

## 2016-06-24 MED ORDER — NITROFURANTOIN MONOHYD MACRO 100 MG PO CAPS: 100.0000 mg | ORAL_CAPSULE | Freq: Two times a day (BID) | ORAL | 0 refills | Status: DC

## 2016-06-24 MED ORDER — PHENAZOPYRIDINE HCL 200 MG PO TABS: 200.0000 mg | ORAL_TABLET | Freq: Three times a day (TID) | ORAL | 0 refills | Status: DC | PRN

## 2016-06-24 NOTE — Patient Instructions (Signed)

## 2016-06-24 NOTE — Progress Notes (Signed)
Patient ID: Felicia Baxter, female   DOB: [birth year], 35 y.o.   MRN: [id number]  35 y.o.Single African American female G1P1001 here with complaint of UTI, with onset  on 06/22/16. Patient complaining of:  urinary frequency and urinary urgency. Patient denies fever, chills, nausea or back pain. No new personal products. Patient feels not related to sexual activity.    Contraception is Mirena.  There is a change in partner since March but denies vaginal symptoms or need for STD's at this time.. Patient does not have adequate water intake.  No fever/ chills.  No back or flank pain  O: Healthy female WDWN Affect: Normal, orientation x 3 Skin : warm and dry CVAT: negative bilateral Abdomen: negative for suprapubic tenderness   POCT:  Moderate leuk's  A:  R/O UTI    P: Reviewed findings of UTI and need for treatment. Rx:  Macrobid 100 mg BID # 14 WUJ:[id number]:Urine micro, culture Reviewed warning signs and symptoms of UTI and need to advise if occurring. Encouraged to limit soda, tea, and coffee   RV prn

## 2016-06-25 LAB — URINALYSIS, MICROSCOPIC ONLY
CASTS: NONE SEEN [LPF]
Crystals: NONE SEEN [HPF]
SQUAMOUS EPITHELIAL / LPF: NONE SEEN [HPF] (ref <=5)
WBC, UA: >60 WBC/HPF — AB (ref <=5)
Yeast: NONE SEEN [HPF]

## 2016-06-27 LAB — URINE CULTURE

## 2016-06-29 NOTE — Progress Notes (Signed)
Encounter reviewed by Dr. Brook Amundson C. Silva.  

## 2016-09-26 ENCOUNTER — Encounter: Payer: Self-pay | Admitting: Nurse Practitioner

## 2016-09-26 ENCOUNTER — Ambulatory Visit (INDEPENDENT_AMBULATORY_CARE_PROVIDER_SITE_OTHER): Payer: BC Managed Care – PPO | Admitting: Nurse Practitioner

## 2016-09-26 VITALS — BP 100/66 | HR 64 | Ht 64.0 in | Wt 140.0 lb

## 2016-09-26 DIAGNOSIS — Z113 Encounter for screening for infections with a predominantly sexual mode of transmission: Secondary | ICD-10-CM

## 2016-09-26 DIAGNOSIS — Z0000 Encounter for general adult medical examination without abnormal findings: Secondary | ICD-10-CM

## 2016-09-26 DIAGNOSIS — Z01419 Encounter for gynecological examination (general) (routine) without abnormal findings: Secondary | ICD-10-CM

## 2016-09-26 LAB — POCT URINALYSIS DIPSTICK
Bilirubin, UA: NEGATIVE
GLUCOSE UA: NEGATIVE
Ketones, UA: NEGATIVE
LEUKOCYTES UA: NEGATIVE
NITRITE UA: NEGATIVE
Protein, UA: NEGATIVE
RBC UA: NEGATIVE
UROBILINOGEN UA: NEGATIVE
pH, UA: 5

## 2016-09-26 LAB — HEMOGLOBIN, FINGERSTICK: Hemoglobin, fingerstick: 13.5 g/dL (ref 12.0–15.0)

## 2016-09-26 NOTE — Patient Instructions (Signed)

## 2016-09-26 NOTE — Progress Notes (Signed)
Patient ID: Raynelle CharyShanell Troiani, female   DOB: [birth year], 36 y.o.   MRN: [id number]  36 y.o. G1P1001 Single  Native American Fe here for annual exam.  Still amenorrhea with IUD.  No cramps or PMS.  Ended last relationship. Request STD's.  Patient's last menstrual period was 01/28/2013 (exact date).          Sexually active: Yes.    The current method of family planning is condoms never and IUD.  Mirena inserted 08/22/13. Exercising: No.  The patient does not participate in regular exercise at present. Smoker:  no  Health Maintenance: Pap:09/21/14, Negative with neg HR HPV History of abnormal with neg colpo in ~2002, ~2006 TDaP: 03/08/09 HIV: 09/25/15 Labs: HB: 13.5  Urine: negative   reports that she has never smoked. She has never used smokeless tobacco. She reports that she drinks alcohol. She reports that she does not use drugs.  Past Medical History:  Diagnosis Date  . Anxiety   . Depression     Past Surgical History:  Procedure Laterality Date  . AUGMENTATION MAMMAPLASTY     implants  . BREAST SURGERY    . CESAREAN SECTION  01/1999  . CYST EXCISION  2006   seb. cyst upper back   . CYST EXCISION Left 2010   left buttock  . INTRAUTERINE DEVICE INSERTION  08/22/13   Mirena, first Mirena inserted 08/2008  . LAPAROSCOPIC CHOLECYSTECTOMY    . REFRACTIVE SURGERY      Current Outpatient Prescriptions  Medication Sig Dispense Refill  . DULoxetine (CYMBALTA) 60 MG capsule Take 1 capsule by mouth daily.    Marland Kitchen. levonorgestrel (MIRENA) 20 MCG/24HR IUD 1 each by Intrauterine route once.    Marland Kitchen. LINZESS 290 MCG CAPS capsule Take 1 tablet by mouth daily.     No current facility-administered medications for this visit.     Family History  Problem Relation Age of Onset  . Cancer Father 6058    Prostate   . Hypertension Maternal Grandmother   . Cancer Maternal Grandfather     ROS:  Pertinent items are noted in HPI.  Otherwise, a comprehensive ROS was negative.  Exam:   BP 100/66 (BP  Location: Right Arm, Patient Position: Sitting, Cuff Size: Normal)   Pulse 64   Ht 5\' 4"  (1.626 m)   Wt 140 lb (63.5 kg)   LMP 01/28/2013 (Exact Date)   BMI 24.03 kg/m  Height: 5\' 4"  (162.6 cm) Ht Readings from Last 3 Encounters:  09/26/16 5\' 4"  (1.626 m)  06/24/16 5' 3.5" (1.613 m)  09/25/15 5' 3.5" (1.613 m)    General appearance: alert, cooperative and appears stated age Head: Normocephalic, without obvious abnormality, atraumatic Neck: no adenopathy, supple, symmetrical, trachea midline and thyroid normal to inspection and palpation Lungs: clear to auscultation bilaterally Breasts: normal appearance, no masses or tenderness Heart: regular rate and rhythm Abdomen: soft, non-tender; no masses,  no organomegaly Extremities: extremities normal, atraumatic, no cyanosis or edema Skin: Skin color, texture, turgor normal. No rashes or lesions Lymph nodes: Cervical, supraclavicular, and axillary nodes normal. No abnormal inguinal nodes palpated Neurologic: Grossly normal   Pelvic: External genitalia:  no lesions              Urethra:  normal appearing urethra with no masses, tenderness or lesions              Bartholin's and Skene's: normal                 Vagina: normal  appearing vagina with normal color and discharge, no lesions              Cervix: anteverted, IUD strings are visible              Pap taken: No. Bimanual Exam:  Uterus:  normal size, contour, position, consistency, mobility, non-tender              Adnexa: no mass, fullness, tenderness               Rectovaginal: Confirms               Anus:  normal sphincter tone, no lesions  Chaperone present: yes  A:  Well Woman with normal exam   R/O STD's Mirena IUD changed 08/22/13             R/O vaginitis           P:   Reviewed health and wellness pertinent to exam  Pap smear not done  Will follow with STD's  Counseled on breast self exam, STD prevention, HIV risk factors and prevention, adequate  intake of calcium and vitamin D, diet and exercise return annually or prn  An After Visit Summary was printed and given to the patient.

## 2016-09-27 LAB — WET PREP BY MOLECULAR PROBE
CANDIDA SPECIES: NEGATIVE
Gardnerella vaginalis: NEGATIVE
Trichomonas vaginosis: NEGATIVE

## 2016-09-27 LAB — STD PANEL
HEP B S AG: NEGATIVE
HIV: NONREACTIVE

## 2016-09-27 LAB — GC/CHLAMYDIA PROBE AMP
CT Probe RNA: NOT DETECTED
GC Probe RNA: NOT DETECTED

## 2016-10-05 NOTE — Progress Notes (Signed)
Encounter reviewed by Dr. Naji Mehringer Amundson C. Silva.  

## 2016-11-10 ENCOUNTER — Ambulatory Visit (INDEPENDENT_AMBULATORY_CARE_PROVIDER_SITE_OTHER): Payer: BC Managed Care – PPO | Admitting: Nurse Practitioner

## 2016-11-10 ENCOUNTER — Encounter: Payer: Self-pay | Admitting: Nurse Practitioner

## 2016-11-10 VITALS — BP 100/60 | HR 88 | Resp 16 | Wt 143.0 lb

## 2016-11-10 DIAGNOSIS — N760 Acute vaginitis: Secondary | ICD-10-CM | POA: Diagnosis not present

## 2016-11-10 MED ORDER — FLUCONAZOLE 150 MG PO TABS: 150.0000 mg | ORAL_TABLET | Freq: Once | ORAL | 1 refills | Status: AC

## 2016-11-10 MED ORDER — METRONIDAZOLE 0.75 % VA GEL: VAGINAL | 3 refills | Status: DC

## 2016-11-10 NOTE — Patient Instructions (Signed)

## 2016-11-10 NOTE — Progress Notes (Signed)
Patient ID: Felicia Baxter, female   DOB: 11/07/1980, 36 y.o.   MRN: [id number]  36 y.o. Single Native American female G1P1001 here with complaint of vaginal symptoms of itching, burning, and increase discharge. Describes discharge as white.   Onset of symptoms 7 days ago. Denies new personal products or vaginal dryness.  Used Metrogel last Tuesday due to vaginal odor.   Symptoms did not improve -actually worsened.  No STD concerns - not SA. Urinary symptoms none . Contraception is Mirena IUD.   O:  Healthy female WDWN Affect: normal, orientation x 3  Exam: Abdomen: Lymph node: no enlargement or tenderness Pelvic exam: External genital: normal female BUS: negative Vagina: white and very thick discharge noted.  Affirm taken. Cervix: normal, non tender, no CMT, IUD is not seen due to amount of discharge    A: Vaginitis - most likely yeast  History of chronic BV and does OK with prn dosing of Metrogel   P: Discussed findings of vaginitis and etiology. Discussed Aveeno or baking soda sitz bath for comfort. Avoid moist clothes or pads for extended period of time. If working out in gym clothes or swim suits for long periods of time change underwear or bottoms of swimsuit if possible. Olive Oil/Coconut Oil use for skin protection prior to activity can be used to external skin.  Rx: Diflucan X 2   Follow with Affirm and decide if Metrogel is needed before she flies out of town on Wednesday  RV prn

## 2016-11-11 LAB — WET PREP BY MOLECULAR PROBE
Candida species: DETECTED — AB
GARDNERELLA VAGINALIS: NOT DETECTED
TRICHOMONAS VAG: NOT DETECTED

## 2016-11-12 NOTE — Progress Notes (Signed)
Encounter reviewed by Dr. Brook Amundson C. Silva.  

## 2016-11-18 ENCOUNTER — Telehealth: Payer: Self-pay | Admitting: Nurse Practitioner

## 2016-11-18 NOTE — Telephone Encounter (Signed)
Patient wants to speak with the nurse no information given. °

## 2016-11-19 MED ORDER — CIPROFLOXACIN HCL 500 MG PO TABS: 500.0000 mg | ORAL_TABLET | Freq: Two times a day (BID) | ORAL | 0 refills | Status: DC

## 2016-11-19 NOTE — Telephone Encounter (Signed)
Agree with plan 

## 2016-11-19 NOTE — Telephone Encounter (Signed)
Spoke with patient after review with Felicia CommentPatricia Grubb, Felicia Baxter. Advised patient Felicia CommentPatricia Grubb, Felicia Baxter would like to start her on Cipro 500 mg bid x 3 days. Will need a urine TOC in the office at the end of next week. Patient is agreeable. Rx for Cipro 500 mg take 1 tablet BID x 3 days #6 0RF sent to pharmacy on file. Nurse visit for urine TOC scheduled for 3/22 at 3:30 pm. Patient is agreeable to date and time.  Felicia CommentPatricia Grubb, FNP do you agree with recommendations?

## 2016-11-19 NOTE — Telephone Encounter (Signed)
Spoke with patient. Patient states that she has been having urinary frequency and urgency since 11/15/2016. Denies burning with urination, fever, or chills. Patient was out of town and started taking AZO for symptom relief. Patient returned home on 11/16/2016 and started taking Macrobid that she had from October 2017. Patient has taken 7 tablets. Took last tablet this morning. Reports she has relief with taking the Macrobid up until she is due for her next dose and then symptoms start again. "I usually am better after a few doses." Advised will review with Ria CommentPatricia Grubb, FNP as she may not have accurate testing in office since she has been on antibiotics. Patient is agreeable.

## 2016-11-27 ENCOUNTER — Ambulatory Visit (INDEPENDENT_AMBULATORY_CARE_PROVIDER_SITE_OTHER): Payer: BC Managed Care – PPO | Admitting: *Deleted

## 2016-11-27 VITALS — BP 96/60 | HR 88 | Resp 16 | Ht 64.0 in | Wt 144.0 lb

## 2016-11-27 DIAGNOSIS — N390 Urinary tract infection, site not specified: Secondary | ICD-10-CM | POA: Diagnosis not present

## 2016-11-27 NOTE — Progress Notes (Signed)
Patient in today for TOC. She completed Abx and has no more Sx. Urine culture sent  [facility]

## 2016-11-28 LAB — URINE CULTURE: Organism ID, Bacteria: NO GROWTH

## 2017-04-13 ENCOUNTER — Telehealth: Payer: Self-pay | Admitting: Obstetrics and Gynecology

## 2017-04-13 NOTE — Telephone Encounter (Signed)
Left message regarding upcoming appointment has been canceled and needs to be rescheduled. °

## 2017-09-29 ENCOUNTER — Ambulatory Visit: Payer: BC Managed Care – PPO | Admitting: Nurse Practitioner

## 2018-02-10 NOTE — Progress Notes (Signed)
37 y.o. G1P1001 SingleNative AmericanF here for annual exam.  She has a mirena IUD, inserted in 12/14. No cycles or problems with the IUD.  Period Cycle (Days): (no cycles with Mirena IUD)  Sexually active, same partner x 8 months. No dyspareunia. Mostly uses condoms.   No LMP recorded. (Menstrual status: IUD).          Sexually active: Yes.    The current method of family planning is IUD-Mirena 08-22-13.    Exercising: No.   Smoker:  no  Health Maintenance: Pap: 09-21-14 Neg:Neg HR HPV, 02-04-13 Neg History of abnormal Pap:  Yes--had neg colpo 2002 and 2006 MMG:  n/a Colonoscopy:  2006 normal (done for IBS) BMD:   n/a TDaP:  03-08-09 Gardasil: n/a, discussed.   reports that she has never smoked. She has never used smokeless tobacco. She reports that she drinks about 0.6 oz of alcohol per week. She reports that she does not use drugs. She has a 38 year old daughter, in college, lives at home. She is Runner, broadcasting/film/videoteacher, 5th grade.  Past Medical History:  Diagnosis Date  . Abnormal Pap smear of cervix ~2002 & ~ 2006   COLPO biopsy with first abnormal  . Anxiety   . Depression   . History of IBS     Past Surgical History:  Procedure Laterality Date  . AUGMENTATION MAMMAPLASTY     implants  . BREAST SURGERY    . CESAREAN SECTION  01/1999  . CYST EXCISION  2006   seb. cyst upper back   . CYST EXCISION Left 2010   left buttock  . INTRAUTERINE DEVICE INSERTION  08/22/13   Mirena, first Mirena inserted 08/2008  . LAPAROSCOPIC CHOLECYSTECTOMY    . REFRACTIVE SURGERY      Current Outpatient Medications  Medication Sig Dispense Refill  . DULoxetine (CYMBALTA) 60 MG capsule Take 1 capsule by mouth daily.    Marland Kitchen. levonorgestrel (MIRENA) 20 MCG/24HR IUD 1 each by Intrauterine route once.    Marland Kitchen. LINZESS 290 MCG CAPS capsule Take 1 tablet by mouth daily.     No current facility-administered medications for this visit.     Family History  Problem Relation Age of Onset  . Cancer Father 858        Prostate   . Hypertension Maternal Grandmother   . Cancer Maternal Grandfather     Review of Systems  Constitutional: Negative.   HENT: Negative.   Eyes: Negative.   Respiratory: Negative.   Cardiovascular: Negative.   Gastrointestinal: Negative.   Endocrine: Negative.   Genitourinary: Negative.   Musculoskeletal: Negative.   Skin: Negative.   Allergic/Immunologic: Negative.   Neurological: Negative.   Psychiatric/Behavioral: Negative.     Exam:   BP 118/70 (BP Location: Right Arm, Patient Position: Sitting, Cuff Size: Normal)   Pulse 60   Resp 16   Ht 5\' 4"  (1.626 m)   Wt 150 lb (68 kg)   BMI 25.75 kg/m   Weight change: @WEIGHTCHANGE @ Height:   Height: 5\' 4"  (162.6 cm)  Ht Readings from Last 3 Encounters:  02/12/18 5\' 4"  (1.626 m)  11/27/16 5\' 4"  (1.626 m)  09/26/16 5\' 4"  (1.626 m)    General appearance: alert, cooperative and appears stated age Head: Normocephalic, without obvious abnormality, atraumatic Neck: no adenopathy, supple, symmetrical, trachea midline and thyroid normal to inspection and palpation Lungs: clear to auscultation bilaterally Cardiovascular: regular rate and rhythm Breasts: normal appearance, no masses or tenderness Abdomen: soft, non-tender; non distended,  no masses,  no organomegaly Extremities: extremities normal, atraumatic, no cyanosis or edema Skin: Skin color, texture, turgor normal. No rashes or lesions Lymph nodes: Cervical, supraclavicular, and axillary nodes normal. No abnormal inguinal nodes palpated Neurologic: Grossly normal   Pelvic: External genitalia:  no lesions              Urethra:  normal appearing urethra with no masses, tenderness or lesions              Bartholins and Skenes: normal                 Vagina: normal appearing vagina with normal color. There is a thick, white vaginal d/c. On questioning the patient reports some increase in vaginal d/c and mild irritation. She self treated with clotrimazole               Cervix: no lesions, IUD string 3 cm               Bimanual Exam:  Uterus:  normal size, contour, position, consistency, mobility, non-tender              Adnexa: no mass, fullness, tenderness               Rectovaginal: Confirms               Anus:  normal sphincter tone, no lesions  Chaperone was present for exam.  A:  Well Woman with normal exam  IUD check  Vaginal d/c  P:   Pap with hpv  STD Screening labs  Return in 12/19 for a new mirena (pre-treat with cytotec), she had severe discomfort with prior insertions  Discussed breast self exam  Discussed calcium and vit D intake  Screening labs  Start gardasil series

## 2018-02-12 ENCOUNTER — Other Ambulatory Visit (HOSPITAL_COMMUNITY)
Admission: RE | Admit: 2018-02-12 | Discharge: 2018-02-12 | Disposition: A | Discharge status: Home / Self Care | Payer: BC Managed Care – PPO | Source: Ambulatory Visit | Attending: Obstetrics and Gynecology | Admitting: Obstetrics and Gynecology

## 2018-02-12 ENCOUNTER — Other Ambulatory Visit: Payer: Self-pay

## 2018-02-12 ENCOUNTER — Encounter: Payer: Self-pay | Admitting: Obstetrics and Gynecology

## 2018-02-12 ENCOUNTER — Ambulatory Visit: Payer: BC Managed Care – PPO | Admitting: Obstetrics and Gynecology

## 2018-02-12 VITALS — BP 118/70 | HR 60 | Resp 16 | Ht 64.0 in | Wt 150.0 lb

## 2018-02-12 DIAGNOSIS — Z124 Encounter for screening for malignant neoplasm of cervix: Secondary | ICD-10-CM

## 2018-02-12 DIAGNOSIS — N898 Other specified noninflammatory disorders of vagina: Secondary | ICD-10-CM | POA: Diagnosis not present

## 2018-02-12 DIAGNOSIS — Z23 Encounter for immunization: Secondary | ICD-10-CM

## 2018-02-12 DIAGNOSIS — Z113 Encounter for screening for infections with a predominantly sexual mode of transmission: Secondary | ICD-10-CM | POA: Diagnosis not present

## 2018-02-12 DIAGNOSIS — Z0000 Encounter for general adult medical examination without abnormal findings: Secondary | ICD-10-CM | POA: Diagnosis not present

## 2018-02-12 DIAGNOSIS — Z30431 Encounter for routine checking of intrauterine contraceptive device: Secondary | ICD-10-CM

## 2018-02-12 DIAGNOSIS — Z7185 Encounter for immunization safety counseling: Secondary | ICD-10-CM

## 2018-02-12 DIAGNOSIS — Z7189 Other specified counseling: Secondary | ICD-10-CM

## 2018-02-12 DIAGNOSIS — Z01419 Encounter for gynecological examination (general) (routine) without abnormal findings: Secondary | ICD-10-CM | POA: Diagnosis not present

## 2018-02-12 NOTE — Patient Instructions (Signed)
EXERCISE AND DIET:  We recommended that you start or continue a regular exercise program for good health. Regular exercise means any activity that makes your heart beat faster and makes you sweat.  We recommend exercising at least 30 minutes per day at least 3 days a week, preferably 4 or 5.  We also recommend a diet low in fat and sugar.  Inactivity, poor dietary choices and obesity can cause diabetes, heart attack, stroke, and kidney damage, among others.    ALCOHOL AND SMOKING:  Women should limit their alcohol intake to no more than 7 drinks/beers/glasses of wine (combined, not each!) per week. Moderation of alcohol intake to this level decreases your risk of breast cancer and liver damage. And of course, no recreational drugs are part of a healthy lifestyle.  And absolutely no smoking or even second hand smoke. Most people know smoking can cause heart and lung diseases, but did you know it also contributes to weakening of your bones? Aging of your skin?  Yellowing of your teeth and nails?  CALCIUM AND VITAMIN D:  Adequate intake of calcium and Vitamin D are recommended.  The recommendations for exact amounts of these supplements seem to change often, but generally speaking 600 mg of calcium (either carbonate or citrate) and 800 units of Vitamin D per day seems prudent. Certain women may benefit from higher intake of Vitamin D.  If you are among these women, your doctor will have told you during your visit.    PAP SMEARS:  Pap smears, to check for cervical cancer or precancers,  have traditionally been done yearly, although recent scientific advances have shown that most women can have pap smears less often.  However, every woman still should have a physical exam from her gynecologist every year. It will include a breast check, inspection of the vulva and vagina to check for abnormal growths or skin changes, a visual exam of the cervix, and then an exam to evaluate the size and shape of the uterus and  ovaries.  And after 38 years of age, a rectal exam is indicated to check for rectal cancers. We will also provide age appropriate advice regarding health maintenance, like when you should have certain vaccines, screening for sexually transmitted diseases, bone density testing, colonoscopy, mammograms, etc.   MAMMOGRAMS:  All women over 40 years old should have a yearly mammogram. Many facilities now offer a "3D" mammogram, which may cost around $50 extra out of pocket. If possible,  we recommend you accept the option to have the 3D mammogram performed.  It both reduces the number of women who will be called back for extra views which then turn out to be normal, and it is better than the routine mammogram at detecting truly abnormal areas.    COLONOSCOPY:  Colonoscopy to screen for colon cancer is recommended for all women at age 50.  We know, you hate the idea of the prep.  We agree, BUT, having colon cancer and not knowing it is worse!!  Colon cancer so often starts as a polyp that can be seen and removed at colonscopy, which can quite literally save your life!  And if your first colonoscopy is normal and you have no family history of colon cancer, most women don't have to have it again for 10 years.  Once every ten years, you can do something that may end up saving your life, right?  We will be happy to help you get it scheduled when you are ready.    Be sure to check your insurance coverage so you understand how much it will cost.  It may be covered as a preventative service at no cost, but you should check your particular policy.      Breast Self-Awareness Breast self-awareness means being familiar with how your breasts look and feel. It involves checking your breasts regularly and reporting any changes to your health care provider. Practicing breast self-awareness is important. A change in your breasts can be a sign of a serious medical problem. Being familiar with how your breasts look and feel allows  you to find any problems early, when treatment is more likely to be successful. All women should practice breast self-awareness, including women who have had breast implants. How to do a breast self-exam One way to learn what is normal for your breasts and whether your breasts are changing is to do a breast self-exam. To do a breast self-exam: Look for Changes  1. Remove all the clothing above your waist. 2. Stand in front of a mirror in a room with good lighting. 3. Put your hands on your hips. 4. Push your hands firmly downward. 5. Compare your breasts in the mirror. Look for differences between them (asymmetry), such as: ? Differences in shape. ? Differences in size. ? Puckers, dips, and bumps in one breast and not the other. 6. Look at each breast for changes in your skin, such as: ? Redness. ? Scaly areas. 7. Look for changes in your nipples, such as: ? Discharge. ? Bleeding. ? Dimpling. ? Redness. ? A change in position. Feel for Changes  Carefully feel your breasts for lumps and changes. It is best to do this while lying on your back on the floor and again while sitting or standing in the shower or tub with soapy water on your skin. Feel each breast in the following way:  Place the arm on the side of the breast you are examining above your head.  Feel your breast with the other hand.  Start in the nipple area and make  inch (2 cm) overlapping circles to feel your breast. Use the pads of your three middle fingers to do this. Apply light pressure, then medium pressure, then firm pressure. The light pressure will allow you to feel the tissue closest to the skin. The medium pressure will allow you to feel the tissue that is a little deeper. The firm pressure will allow you to feel the tissue close to the ribs.  Continue the overlapping circles, moving downward over the breast until you feel your ribs below your breast.  Move one finger-width toward the center of the body.  Continue to use the  inch (2 cm) overlapping circles to feel your breast as you move slowly up toward your collarbone.  Continue the up and down exam using all three pressures until you reach your armpit.  Write Down What You Find  Write down what is normal for each breast and any changes that you find. Keep a written record with breast changes or normal findings for each breast. By writing this information down, you do not need to depend only on memory for size, tenderness, or location. Write down where you are in your menstrual cycle, if you are still menstruating. If you are having trouble noticing differences in your breasts, do not get discouraged. With time you will become more familiar with the variations in your breasts and more comfortable with the exam. How often should I examine my breasts? Examine   your breasts every month. If you are breastfeeding, the best time to examine your breasts is after a feeding or after using a breast pump. If you menstruate, the best time to examine your breasts is 5-7 days after your period is over. During your period, your breasts are lumpier, and it may be more difficult to notice changes. When should I see my health care provider? See your health care provider if you notice:  A change in shape or size of your breasts or nipples.  A change in the skin of your breast or nipples, such as a reddened or scaly area.  Unusual discharge from your nipples.  A lump or thick area that was not there before.  Pain in your breasts.  Anything that concerns you.  This information is not intended to replace advice given to you by your health care provider. Make sure you discuss any questions you have with your health care provider. Document Released: 08/25/2005 Document Revised: 01/31/2016 Document Reviewed: 07/15/2015 Elsevier Interactive Patient Education  2018 Elsevier Inc.  

## 2018-02-13 LAB — COMPREHENSIVE METABOLIC PANEL
A/G RATIO: 1.7 (ref 1.2–2.2)
ALK PHOS: 72 IU/L (ref 39–117)
ALT: 12 IU/L (ref 0–32)
AST: 15 IU/L (ref 0–40)
Albumin: 4.4 g/dL (ref 3.5–5.5)
BILIRUBIN TOTAL: 0.3 mg/dL (ref 0.0–1.2)
BUN/Creatinine Ratio: 10 (ref 9–23)
BUN: 9 mg/dL (ref 6–20)
CHLORIDE: 99 mmol/L (ref 96–106)
CO2: 28 mmol/L (ref 20–29)
Calcium: 9.4 mg/dL (ref 8.7–10.2)
Creatinine, Ser: 0.87 mg/dL (ref 0.57–1.00)
GFR calc Af Amer: 98 mL/min/{1.73_m2} (ref >59)
GFR calc non Af Amer: 85 mL/min/{1.73_m2} (ref >59)
Globulin, Total: 2.6 g/dL (ref 1.5–4.5)
Glucose: 86 mg/dL (ref 65–99)
POTASSIUM: 4.2 mmol/L (ref 3.5–5.2)
Sodium: 141 mmol/L (ref 134–144)
Total Protein: 7 g/dL (ref 6.0–8.5)

## 2018-02-13 LAB — HEP, RPR, HIV PANEL
HIV Screen 4th Generation wRfx: NONREACTIVE
Hepatitis B Surface Ag: NEGATIVE
RPR Ser Ql: NONREACTIVE

## 2018-02-13 LAB — CBC
Hematocrit: 40.9 % (ref 34.0–46.6)
Hemoglobin: 13.5 g/dL (ref 11.1–15.9)
MCH: 28.2 pg (ref 26.6–33.0)
MCHC: 33 g/dL (ref 31.5–35.7)
MCV: 86 fL (ref 79–97)
PLATELETS: 275 10*3/uL (ref 150–450)
RBC: 4.78 x10E6/uL (ref 3.77–5.28)
RDW: 13.4 % (ref 12.3–15.4)
WBC: 9.3 10*3/uL (ref 3.4–10.8)

## 2018-02-13 LAB — VAGINITIS/VAGINOSIS, DNA PROBE
CANDIDA SPECIES: NEGATIVE
GARDNERELLA VAGINALIS: POSITIVE — AB
Trichomonas vaginosis: NEGATIVE

## 2018-02-13 LAB — LIPID PANEL
Chol/HDL Ratio: 3 ratio (ref 0.0–4.4)
Cholesterol, Total: 141 mg/dL (ref 100–199)
HDL: 47 mg/dL (ref >39)
LDL Calculated: 68 mg/dL (ref 0–99)
TRIGLYCERIDES: 129 mg/dL (ref 0–149)
VLDL Cholesterol Cal: 26 mg/dL (ref 5–40)

## 2018-02-15 ENCOUNTER — Telehealth: Payer: Self-pay | Admitting: *Deleted

## 2018-02-15 MED ORDER — METRONIDAZOLE 500 MG PO TABS: 500.0000 mg | ORAL_TABLET | Freq: Two times a day (BID) | ORAL | 0 refills | Status: DC

## 2018-02-15 NOTE — Telephone Encounter (Signed)
-----   Message from Romualdo BolkJill Evelyn Jertson, MD sent at 02/13/2018  1:03 PM EDT ----- Please inform the patient that her vaginitis probe was + for BV and treat with flagyl (either oral or vaginal, her choice), no ETOH while on Flagyl.  Oral: Flagyl 500 mg BID x 7 days, or Vaginal: Metrogel, 1 applicator per vagina q day x 5 days.

## 2018-02-15 NOTE — Telephone Encounter (Signed)
Spoke with patient and gave results and recommendations. Patient was advised to avoid alcohol while taking the Flagyl. Patient voiced understanding. RX for Flagyl sent to pharmacy -eh

## 2018-02-16 LAB — CYTOLOGY - PAP
CHLAMYDIA, DNA PROBE: NEGATIVE
DIAGNOSIS: NEGATIVE
HPV: NOT DETECTED
Neisseria Gonorrhea: NEGATIVE

## 2018-03-10 ENCOUNTER — Other Ambulatory Visit: Payer: Self-pay | Admitting: Certified Nurse Midwife

## 2018-03-10 ENCOUNTER — Ambulatory Visit: Payer: BC Managed Care – PPO | Admitting: Certified Nurse Midwife

## 2018-03-10 ENCOUNTER — Other Ambulatory Visit: Payer: Self-pay

## 2018-03-10 ENCOUNTER — Encounter: Payer: Self-pay | Admitting: Certified Nurse Midwife

## 2018-03-10 VITALS — BP 110/78 | HR 64 | Temp 97.9°F | Resp 16 | Ht 64.0 in | Wt 151.0 lb

## 2018-03-10 DIAGNOSIS — N390 Urinary tract infection, site not specified: Secondary | ICD-10-CM

## 2018-03-10 DIAGNOSIS — R319 Hematuria, unspecified: Secondary | ICD-10-CM

## 2018-03-10 DIAGNOSIS — N3001 Acute cystitis with hematuria: Secondary | ICD-10-CM

## 2018-03-10 LAB — POCT URINALYSIS DIPSTICK
Bilirubin, UA: NEGATIVE
GLUCOSE UA: NEGATIVE
KETONES UA: NEGATIVE
NITRITE UA: NEGATIVE
PROTEIN UA: POSITIVE — AB
Urobilinogen, UA: NEGATIVE E.U./dL — AB
pH, UA: 5 (ref 5.0–8.0)

## 2018-03-10 MED ORDER — PHENAZOPYRIDINE HCL 100 MG PO TABS: 100.0000 mg | ORAL_TABLET | Freq: Three times a day (TID) | ORAL | 0 refills | Status: DC | PRN

## 2018-03-10 MED ORDER — CIPROFLOXACIN HCL 500 MG PO TABS: ORAL_TABLET | ORAL | 0 refills | Status: DC

## 2018-03-10 NOTE — Patient Instructions (Signed)

## 2018-03-10 NOTE — Progress Notes (Signed)
37 y.o. Single Native American female G1P1001 here with complaint of UTI, with onset  in last 24 hours [place] Patient complaining of urinary frequency/urgency/ and pain with urination. Patient denies fever, chills, nausea or back pain. No new personal products. Patient feels may be related  to sexual activity 4 days ago.. Denies any vaginal symptoms.    Contraception is IUD/condoms. Patient has not had adequate water intake today.. Recent use of alcohol which can cause her to have urinary  frequency with use, but does not have pain with urination when she drinks alcohol. No other health issues today.  Review of Systems  Constitutional: Negative for chills, fever and malaise/fatigue.  Genitourinary: Positive for dysuria, frequency, hematuria and urgency.       Has noted urinary odor  Skin: Negative.   Neurological: Negative for dizziness, weakness and headaches.  Psychiatric/Behavioral: The patient is not nervous/anxious.     O: Healthy female WDWN Affect: Normal, orientation x 3 Skin : warm and dry CVAT: negative bilateral Abdomen: positive  for suprapubic tenderness  Pelvic exam: External genital area: normal, no lesions or redness Bladder,Urethra tender, Urethral meatus: tender, slightly red Vagina: normal vaginal discharge, normal appearance  Cervix: normal, non tender, IUD string noted in cervix Uterus:normal,non tender Adnexa: normal non tender, no fullness or masses   A: UTI sudden onset ? Coital related Normal pelvic exam Poct urine-rbc 2+, wbc 2+, pr 3+  P: Reviewed findings of UTI and need for treatment. Patient agreeable, "want to feel better soon". ZO:[id number]:Cipro see order with instructions Rx Pyridium see order with instructions AVW:UJWJXLab:Urine micro, culture Reviewed warning signs and symptoms of UTI and need to advise if occurring or if not resolving. Encouraged to limit soda, tea, and coffee and be sure to increase water intake. Discussed importance of  emptying bladder after sexual activity to help with prevention. Questions addressed.  RV prn

## 2018-03-11 LAB — URINALYSIS, MICROSCOPIC ONLY
Casts: NONE SEEN /lpf
RBC, UA: >30 /hpf — AB (ref 0–2)

## 2018-03-12 LAB — URINE CULTURE

## 2018-04-14 ENCOUNTER — Ambulatory Visit (INDEPENDENT_AMBULATORY_CARE_PROVIDER_SITE_OTHER): Payer: BC Managed Care – PPO

## 2018-04-14 ENCOUNTER — Ambulatory Visit: Payer: BC Managed Care – PPO

## 2018-04-14 VITALS — BP 110/60 | HR 108 | Resp 14 | Ht 64.0 in | Wt 150.0 lb

## 2018-04-14 DIAGNOSIS — Z23 Encounter for immunization: Secondary | ICD-10-CM | POA: Diagnosis not present

## 2018-04-14 NOTE — Progress Notes (Deleted)
Patient in today for 2nd Gardasil injection.   Contraception: IUD LMP: no cycle IUD Last AEX:02/12/18 with JJ  Injection given in ***. Patient tolerated shot well.   Patient informed next injection due in about 4 months.  Advised patient, if not on birth control, to return for next injection with cycle.   Routed to provider for final review.  Encounter closed.

## 2018-04-14 NOTE — Progress Notes (Signed)
Patient in today for 2nd Gardasil injection.   Contraception: Iud  LMP: IUD Last AEX: 02/12/18 with JJ  Injection given in Left deltoid . Patient tolerated shot well.   Patient informed next injection due in about 4months.  Advised patient, if not on birth control, to return for next injection with cycle.   Routed to provider for final review.  Encounter closed.

## 2018-06-21 ENCOUNTER — Ambulatory Visit: Payer: BC Managed Care – PPO | Admitting: Obstetrics and Gynecology

## 2018-06-21 ENCOUNTER — Encounter: Payer: Self-pay | Admitting: Obstetrics and Gynecology

## 2018-06-21 ENCOUNTER — Other Ambulatory Visit: Payer: Self-pay

## 2018-06-21 VITALS — BP 128/86 | HR 88 | Wt 151.0 lb

## 2018-06-21 DIAGNOSIS — N3090 Cystitis, unspecified without hematuria: Secondary | ICD-10-CM

## 2018-06-21 LAB — POCT URINALYSIS DIPSTICK
BILIRUBIN UA: NEGATIVE
GLUCOSE UA: NEGATIVE
Ketones, UA: NEGATIVE
Nitrite, UA: POSITIVE
PH UA: 5 (ref 5.0–8.0)
Protein, UA: NEGATIVE
RBC UA: POSITIVE
SPEC GRAV UA: 1.01 (ref 1.010–1.025)
UROBILINOGEN UA: 0.2 U/dL

## 2018-06-21 MED ORDER — PHENAZOPYRIDINE HCL 200 MG PO TABS: 200.0000 mg | ORAL_TABLET | Freq: Three times a day (TID) | ORAL | 0 refills | Status: DC | PRN

## 2018-06-21 MED ORDER — SULFAMETHOXAZOLE-TRIMETHOPRIM 800-160 MG PO TABS: 1.0000 | ORAL_TABLET | Freq: Two times a day (BID) | ORAL | 0 refills | Status: DC

## 2018-06-21 NOTE — Progress Notes (Signed)
GYNECOLOGY  VISIT   HPI: 37 y.o.   Single Unavailable Not Hispanic or Latino  female   G1P1001 with No LMP recorded. (Menstrual status: IUD).   here for UTI symptoms. Reports frequency and odor that started yesterday. She woke up during the night with urinary frequency and urgency, voiding small amounts. Urine has an odor. No dysuria. No fever, no abdominal pain, no flank pain. Sexually active, same partner, no change in sexual active.  The patient was treated for a UTI in 7/19  GYNECOLOGIC HISTORY: No LMP recorded. (Menstrual status: IUD). Contraception:Mirena Menopausal hormone therapy: None        OB History    Gravida  1   Para  1   Term  1   Preterm  0   AB  0   Living  1     SAB  0   TAB  0   Ectopic  0   Multiple  0   Live Births  1              There are no active problems to display for this patient.   Past Medical History:  Diagnosis Date  . Abnormal Pap smear of cervix ~2002 & ~ 2006   COLPO biopsy with first abnormal  . Anxiety   . Depression   . History of IBS     Past Surgical History:  Procedure Laterality Date  . AUGMENTATION MAMMAPLASTY     implants  . BREAST SURGERY    . CESAREAN SECTION  01/1999  . CYST EXCISION  2006   seb. cyst upper back   . CYST EXCISION Left 2010   left buttock  . INTRAUTERINE DEVICE INSERTION  08/22/13   Mirena, first Mirena inserted 08/2008  . LAPAROSCOPIC CHOLECYSTECTOMY    . REFRACTIVE SURGERY      Current Outpatient Medications  Medication Sig Dispense Refill  . DULoxetine (CYMBALTA) 60 MG capsule Take 1 capsule by mouth daily.    Marland Kitchen levonorgestrel (MIRENA) 20 MCG/24HR IUD 1 each by Intrauterine route once.    Marland Kitchen LINZESS 290 MCG CAPS capsule Take 1 tablet by mouth daily.    . phenazopyridine (PYRIDIUM) 100 MG tablet Take 1 tablet (100 mg total) by mouth 3 (three) times daily as needed for pain. 12 tablet 0  . ciprofloxacin (CIPRO) 500 MG tablet One twice daily for 3 days, if symptoms still present  finish prescription (Patient not taking: Reported on 06/21/2018) 10 tablet 0   No current facility-administered medications for this visit.      ALLERGIES: Percocet [oxycodone-acetaminophen]  Family History  Problem Relation Age of Onset  . Cancer Father 28       Prostate   . Hypertension Maternal Grandmother   . Cancer Maternal Grandfather     Social History   Socioeconomic History  . Marital status: Single    Spouse name: Not on file  . Number of children: Not on file  . Years of education: Not on file  . Highest education level: Not on file  Occupational History  . Not on file  Social Needs  . Financial resource strain: Not on file  . Food insecurity:    Worry: Not on file    Inability: Not on file  . Transportation needs:    Medical: Not on file    Non-medical: Not on file  Tobacco Use  . Smoking status: Never Smoker  . Smokeless tobacco: Never Used  Substance and Sexual Activity  . Alcohol use:  Yes    Alcohol/week: 1.0 standard drinks    Types: 1 Glasses of wine per week  . Drug use: No  . Sexual activity: Yes    Partners: Male    Birth control/protection: IUD    Comment: Mirena inserted 08/22/13  Lifestyle  . Physical activity:    Days per week: Not on file    Minutes per session: Not on file  . Stress: Not on file  Relationships  . Social connections:    Talks on phone: Not on file    Gets together: Not on file    Attends religious service: Not on file    Active member of club or organization: Not on file    Attends meetings of clubs or organizations: Not on file    Relationship status: Not on file  . Intimate partner violence:    Fear of current or ex partner: Not on file    Emotionally abused: Not on file    Physically abused: Not on file    Forced sexual activity: Not on file  Other Topics Concern  . Not on file  Social History Narrative  . Not on file    Review of Systems  Constitutional: Negative.   HENT: Negative.   Eyes: Negative.    Respiratory: Negative.   Cardiovascular: Negative.   Gastrointestinal: Negative.   Genitourinary: Positive for frequency.  Musculoskeletal: Negative.   Skin: Negative.   Neurological: Negative.   Endo/Heme/Allergies: Negative.   Psychiatric/Behavioral: Negative.     PHYSICAL EXAMINATION:    There were no vitals taken for this visit.    General appearance: alert, cooperative and appears stated age CVA: not tender Abdomen: soft, non-tender; non distended, no masses,  no organomegaly   Urine dip with 2+ leuk, +blood, +nitrate  ASSESSMENT Cystitis    PLAN Treat with bactrim DS and pyridium Send urine for ua, c&s If not better in 48 hours she is to call   An After Visit Summary was printed and given to the patient.

## 2018-06-21 NOTE — Patient Instructions (Signed)

## 2018-06-22 ENCOUNTER — Telehealth: Payer: Self-pay | Admitting: Obstetrics and Gynecology

## 2018-06-22 LAB — URINALYSIS, MICROSCOPIC ONLY
CASTS: NONE SEEN /LPF
RBC, UA: >30 /hpf — AB (ref 0–2)

## 2018-06-22 NOTE — Telephone Encounter (Signed)
Reviewed with Leota Sauers, CNM, agrees with recommendation to see PCP.   Encounter closed.

## 2018-06-22 NOTE — Telephone Encounter (Signed)
Spoke with patient. Patient was in office on 10/14, started bactrim for UTI. Patient states she woke up this morning with pain in both sides of her neck, more when she moves neck to right side. Has to move whole body to move her head. Is unsure if she slept wrong or if something else is going on. Tried ibuprofen this morning, no relief, pain 4/10. Denies any recent injury.  Denies any GYN symptoms, fever/chills, N/V, headache or palpable lumps.   Instructed patient to f/u with PCP for further evaluation. Advised if GYN appt needed after evaluation, return call to office. Advised with review with covering provider and return call with any additional recommendations.   Routing to [employer], CNM.  Cc: Dr. Oscar La

## 2018-06-22 NOTE — Telephone Encounter (Signed)
Patient was seen yesterday for a urinary tract infection. She said she is experiencing a new symptom of neck pain and would like to speak with the nurse about her concern.

## 2018-06-24 LAB — URINE CULTURE

## 2018-07-12 ENCOUNTER — Ambulatory Visit: Payer: BC Managed Care – PPO | Admitting: Obstetrics and Gynecology

## 2018-07-12 ENCOUNTER — Other Ambulatory Visit: Payer: Self-pay

## 2018-07-12 ENCOUNTER — Encounter: Payer: Self-pay | Admitting: Obstetrics and Gynecology

## 2018-07-12 ENCOUNTER — Telehealth: Payer: Self-pay | Admitting: Obstetrics and Gynecology

## 2018-07-12 VITALS — BP 122/76 | HR 100 | Temp 98.2°F | Resp 16 | Ht 64.0 in | Wt 149.0 lb

## 2018-07-12 DIAGNOSIS — R319 Hematuria, unspecified: Secondary | ICD-10-CM

## 2018-07-12 DIAGNOSIS — N760 Acute vaginitis: Secondary | ICD-10-CM | POA: Diagnosis not present

## 2018-07-12 LAB — POCT URINALYSIS DIPSTICK
Bilirubin, UA: NEGATIVE
GLUCOSE UA: NEGATIVE
Ketones, UA: NEGATIVE
Nitrite, UA: NEGATIVE
PROTEIN UA: NEGATIVE
Urobilinogen, UA: 0.2 E.U./dL
pH, UA: 6 (ref 5.0–8.0)

## 2018-07-12 MED ORDER — METRONIDAZOLE 500 MG PO TABS: 500.0000 mg | ORAL_TABLET | Freq: Two times a day (BID) | ORAL | 0 refills | Status: DC

## 2018-07-12 MED ORDER — BETAMETHASONE VALERATE 0.1 % EX OINT: TOPICAL_OINTMENT | CUTANEOUS | 0 refills | Status: DC

## 2018-07-12 NOTE — Addendum Note (Signed)
Addended by: Loreta Ave on: 07/12/2018 04:08 PM   Modules accepted: Orders

## 2018-07-12 NOTE — Progress Notes (Signed)
GYNECOLOGY  VISIT   HPI: 37 y.o.   Single Unavailable Not Hispanic or Latino  female   G1P1001 with Patient's last menstrual period was 09/08/2013 (approximate).   here for hematuria and vaginal itching.   She noticed blood when she wiped yesterday 3 different times after voiding, none today. No blood on her underwear, no blood in the toilet.  Vaginal itching started 4 days ago, by last night it was bad. She used monistat last night (externally only). Feels a little better today. No d/c noted prior to using the monistat.  No urinary urgency, frequency or pain with urination.   GYNECOLOGIC HISTORY: Patient's last menstrual period was 09/08/2013 (approximate). Contraception: IUD, mirena (12/14) Menopausal hormone therapy: None        OB History    Gravida  1   Para  1   Term  1   Preterm  0   AB  0   Living  1     SAB  0   TAB  0   Ectopic  0   Multiple  0   Live Births  1              There are no active problems to display for this patient.   Past Medical History:  Diagnosis Date  . Abnormal Pap smear of cervix ~2002 & ~ 2006   COLPO biopsy with first abnormal  . Anxiety   . Depression   . History of IBS     Past Surgical History:  Procedure Laterality Date  . AUGMENTATION MAMMAPLASTY     implants  . BREAST SURGERY    . CESAREAN SECTION  01/1999  . CYST EXCISION  2006   seb. cyst upper back   . CYST EXCISION Left 2010   left buttock  . INTRAUTERINE DEVICE INSERTION  08/22/13   Mirena, first Mirena inserted 08/2008  . LAPAROSCOPIC CHOLECYSTECTOMY    . REFRACTIVE SURGERY      Current Outpatient Medications  Medication Sig Dispense Refill  . DULoxetine (CYMBALTA) 60 MG capsule Take 1 capsule by mouth daily.    Marland Kitchen levonorgestrel (MIRENA) 20 MCG/24HR IUD 1 each by Intrauterine route once.    Marland Kitchen LINZESS 290 MCG CAPS capsule Take 1 tablet by mouth daily.     No current facility-administered medications for this visit.      ALLERGIES: Percocet  [oxycodone-acetaminophen]  Family History  Problem Relation Age of Onset  . Cancer Father 57       Prostate   . Hypertension Maternal Grandmother   . Cancer Maternal Grandfather     Social History   Socioeconomic History  . Marital status: Single    Spouse name: Not on file  . Number of children: Not on file  . Years of education: Not on file  . Highest education level: Not on file  Occupational History  . Not on file  Social Needs  . Financial resource strain: Not on file  . Food insecurity:    Worry: Not on file    Inability: Not on file  . Transportation needs:    Medical: Not on file    Non-medical: Not on file  Tobacco Use  . Smoking status: Never Smoker  . Smokeless tobacco: Never Used  Substance and Sexual Activity  . Alcohol use: Yes    Alcohol/week: 1.0 standard drinks    Types: 1 Glasses of wine per week  . Drug use: No  . Sexual activity: Yes    Partners:  Male    Birth control/protection: IUD    Comment: Mirena inserted 08/22/13  Lifestyle  . Physical activity:    Days per week: Not on file    Minutes per session: Not on file  . Stress: Not on file  Relationships  . Social connections:    Talks on phone: Not on file    Gets together: Not on file    Attends religious service: Not on file    Active member of club or organization: Not on file    Attends meetings of clubs or organizations: Not on file    Relationship status: Not on file  . Intimate partner violence:    Fear of current or ex partner: Not on file    Emotionally abused: Not on file    Physically abused: Not on file    Forced sexual activity: Not on file  Other Topics Concern  . Not on file  Social History Narrative  . Not on file    Review of Systems  Constitutional: Negative.   HENT: Negative.   Eyes: Negative.   Respiratory: Negative.   Cardiovascular: Negative.   Gastrointestinal: Negative.   Genitourinary: Positive for hematuria.       Vulvar itching   Musculoskeletal:  Negative.   Skin: Negative.   Neurological: Negative.   Endo/Heme/Allergies: Negative.   Psychiatric/Behavioral: Negative.   All other systems reviewed and are negative.   PHYSICAL EXAMINATION:    BP 122/76 (BP Location: Right Arm, Patient Position: Sitting, Cuff Size: Normal)   Pulse 100   Temp 98.2 F (36.8 C) (Oral)   Resp 16   Ht 5\' 4"  (1.626 m)   Wt 149 lb (67.6 kg)   LMP 09/08/2013 (Approximate)   BMI 25.58 kg/m     General appearance: alert, cooperative and appears stated age  Pelvic: External genitalia:  no lesions              Urethra:  normal appearing urethra with no masses, tenderness or lesions              Bartholins and Skenes: normal                 Vagina: mildly erythematous appearing vagina with an increase in watery, yellow vaginal d/c              Cervix: no lesions               Chaperone was present for exam.  Urine dip: trace blood, trace leuk  Wet prep: + clue, no trich, rare wbc KOH: no yeast PH: 4.5   ASSESSMENT Vulvovaginitis, c/w BV on exam, slides. Symptoms more c/w yeast Hematuria     PLAN Treat for BV, steroid ointment for itching Send affirm Send urine for ua, c&s   An After Visit Summary was printed and given to the patient.

## 2018-07-12 NOTE — Patient Instructions (Signed)

## 2018-07-12 NOTE — Telephone Encounter (Signed)
Vaginal itching and hematuria x 1 week. Has used monistat with some relief.  Office visit today with Dr. Oscar La Encounter closed.

## 2018-07-12 NOTE — Telephone Encounter (Signed)
Patient stated that she is experiencing itching and noticed blood when she urinated yesterday. Patient stated that she hasn't had a period in about 5 years.

## 2018-07-13 ENCOUNTER — Telehealth: Payer: Self-pay | Admitting: Obstetrics and Gynecology

## 2018-07-13 LAB — VAGINITIS/VAGINOSIS, DNA PROBE
Candida Species: NEGATIVE
Gardnerella vaginalis: NEGATIVE
TRICHOMONAS VAG: NEGATIVE

## 2018-07-13 LAB — URINALYSIS, MICROSCOPIC ONLY
Bacteria, UA: NONE SEEN
Casts: NONE SEEN /lpf

## 2018-07-13 LAB — URINE CULTURE: Organism ID, Bacteria: NO GROWTH

## 2018-07-13 NOTE — Telephone Encounter (Signed)
Attempted to reach patient at number provided, there was no answer and no voicemail available.  Notes recorded by Ginny Forth, RN on 07/13/2018 at 1:10 PM EST Attempted to reach patient at number provided, there was no answer and no voicemail available. ------  Notes recorded by Romualdo Bolk, MD on 07/13/2018 at 12:48 PM EST Please let the patient know that her vaginitis panel was negative (can have false negatives). Slides yesterday c/w BV, I would recommend that she continue the flagyl as prescribed. Her urinalysis was fine, culture is pending.

## 2018-07-13 NOTE — Telephone Encounter (Signed)
Patient returning call. No telephone note. Patient assuming it is regarding results from yesterday's appointment.

## 2018-07-13 NOTE — Telephone Encounter (Signed)
Spoke with patient. Results given. Patient verbalizes understanding. Encounter closed. 

## 2018-07-14 ENCOUNTER — Telehealth: Payer: Self-pay

## 2018-07-14 NOTE — Telephone Encounter (Signed)
Notes recorded by Sprague, Caroleen Hamman, RN on 07/14/2018 at 4:46 PM EST Attempted to reach patient at number provided. There was no answer and no voicemail box set up.

## 2018-07-14 NOTE — Telephone Encounter (Signed)
-----   Message from Romualdo Bolk, MD sent at 07/14/2018 12:59 PM EST ----- Please advise the patient of normal results.

## 2018-07-19 NOTE — Telephone Encounter (Signed)
Spoke with patient. Results given. Patient verbalizes understanding. Encounter closed. 

## 2018-08-17 NOTE — Progress Notes (Signed)
GYNECOLOGY  VISIT   HPI: 37 y.o.   Single Not Hispanic or Latino  female   G1P1001 with No LMP recorded. (Menstrual status: IUD).   here for Mirena removal and possible reinsertion. Wants to discuss Depo provera injections. Considering another pregnancy in another year or two.  GYNECOLOGIC HISTORY: No LMP recorded. (Menstrual status: IUD). Contraception: IUD Menopausal hormone therapy: None        OB History    Gravida  1   Para  1   Term  1   Preterm  0   AB  0   Living  1     SAB  0   TAB  0   Ectopic  0   Multiple  0   Live Births  1              There are no active problems to display for this patient.   Past Medical History:  Diagnosis Date  . Abnormal Pap smear of cervix ~2002 & ~ 2006   COLPO biopsy with first abnormal  . Anxiety   . Depression   . History of IBS     Past Surgical History:  Procedure Laterality Date  . AUGMENTATION MAMMAPLASTY     implants  . BREAST SURGERY    . CESAREAN SECTION  01/1999  . CYST EXCISION  2006   seb. cyst upper back   . CYST EXCISION Left 2010   left buttock  . INTRAUTERINE DEVICE INSERTION  08/22/13   Mirena, first Mirena inserted 08/2008  . LAPAROSCOPIC CHOLECYSTECTOMY    . REFRACTIVE SURGERY      Current Outpatient Medications  Medication Sig Dispense Refill  . betamethasone valerate ointment (VALISONE) 0.1 % Apply a pea sized amount topically BID for 1-2 weeks as needed 15 g 0  . DULoxetine (CYMBALTA) 60 MG capsule Take 1 capsule by mouth daily.    Marland Kitchen levonorgestrel (MIRENA) 20 MCG/24HR IUD 1 each by Intrauterine route once.    Marland Kitchen LINZESS 290 MCG CAPS capsule Take 1 tablet by mouth daily.     No current facility-administered medications for this visit.      ALLERGIES: Percocet [oxycodone-acetaminophen]  Family History  Problem Relation Age of Onset  . Cancer Father 30       Prostate   . Hypertension Maternal Grandmother   . Cancer Maternal Grandfather     Social History   Socioeconomic  History  . Marital status: Single    Spouse name: Not on file  . Number of children: Not on file  . Years of education: Not on file  . Highest education level: Not on file  Occupational History  . Not on file  Social Needs  . Financial resource strain: Not on file  . Food insecurity:    Worry: Not on file    Inability: Not on file  . Transportation needs:    Medical: Not on file    Non-medical: Not on file  Tobacco Use  . Smoking status: Never Smoker  . Smokeless tobacco: Never Used  Substance and Sexual Activity  . Alcohol use: Yes    Alcohol/week: 1.0 standard drinks    Types: 1 Glasses of wine per week  . Drug use: No  . Sexual activity: Yes    Partners: Male    Birth control/protection: IUD    Comment: Mirena inserted 08/22/13  Lifestyle  . Physical activity:    Days per week: Not on file    Minutes per session: Not  on file  . Stress: Not on file  Relationships  . Social connections:    Talks on phone: Not on file    Gets together: Not on file    Attends religious service: Not on file    Active member of club or organization: Not on file    Attends meetings of clubs or organizations: Not on file    Relationship status: Not on file  . Intimate partner violence:    Fear of current or ex partner: Not on file    Emotionally abused: Not on file    Physically abused: Not on file    Forced sexual activity: Not on file  Other Topics Concern  . Not on file  Social History Narrative  . Not on file    Review of Systems  Constitutional: Negative.   HENT: Negative.   Eyes: Negative.   Respiratory: Negative.   Cardiovascular: Negative.   Gastrointestinal: Negative.   Genitourinary: Negative.   Musculoskeletal: Negative.   Skin: Negative.   Neurological: Negative.   Endo/Heme/Allergies: Negative.   Psychiatric/Behavioral: Negative.     PHYSICAL EXAMINATION:    BP 120/82 (BP Location: Right Arm, Patient Position: Sitting, Cuff Size: Normal)   Pulse 80   Wt  148 lb 3.2 oz (67.2 kg)   BMI 25.44 kg/m     General appearance: alert, cooperative and appears stated age   Pelvic: External genitalia:  no lesions              Urethra:  normal appearing urethra with no masses, tenderness or lesions              Bartholins and Skenes: normal                 Vagina: normal appearing vagina with normal color and discharge, no lesions              Cervix: no lesions               The risks of the mirena IUD were reviewed with the patient, including infection, abnormal bleeding and uterine perfortion. Consent was signed.  A speculum was placed in the vagina, the IUD was removed with ring forceps. The cervix was cleansed with betadine. A tenaculum was placed on the cervix, the uterus sounded to 8 cm. The cervix was dilated to a 5 hagar dilator  The mirena IUD was inserted without difficulty. The string were cut to 3-4 cm. The tenaculum was removed. Slight oozing from the tenaculum site was stopped with pressure.   The patient tolerated the procedure well.    Chaperone was present for exam.  ASSESSMENT Contraception counseling, discussed options for contraception, including: OCP's, depo-provera and another mirena IUD. She doesn't want to have cycles. After discussion she desires mirena IUD I encouraged her not to wait to conceive, discussed the increase in infertility with age (she isn't sure what she wants to do, okay if she doesn't have another baby)    PLAN Genprobe IUD removal and reinsertion (mirena) F/U in one month   An After Visit Summary was printed and given to the patient.

## 2018-08-18 ENCOUNTER — Ambulatory Visit (INDEPENDENT_AMBULATORY_CARE_PROVIDER_SITE_OTHER): Payer: BC Managed Care – PPO | Admitting: Obstetrics and Gynecology

## 2018-08-18 ENCOUNTER — Other Ambulatory Visit: Payer: Self-pay

## 2018-08-18 ENCOUNTER — Encounter (INDEPENDENT_AMBULATORY_CARE_PROVIDER_SITE_OTHER): Payer: Self-pay

## 2018-08-18 ENCOUNTER — Encounter: Payer: Self-pay | Admitting: Obstetrics and Gynecology

## 2018-08-18 VITALS — BP 120/82 | HR 80 | Wt 148.2 lb

## 2018-08-18 DIAGNOSIS — Z01812 Encounter for preprocedural laboratory examination: Secondary | ICD-10-CM | POA: Diagnosis not present

## 2018-08-18 DIAGNOSIS — Z30433 Encounter for removal and reinsertion of intrauterine contraceptive device: Secondary | ICD-10-CM

## 2018-08-18 DIAGNOSIS — Z3009 Encounter for other general counseling and advice on contraception: Secondary | ICD-10-CM

## 2018-08-18 DIAGNOSIS — Z113 Encounter for screening for infections with a predominantly sexual mode of transmission: Secondary | ICD-10-CM

## 2018-08-18 LAB — POCT URINE PREGNANCY: Preg Test, Ur: NEGATIVE

## 2018-08-18 NOTE — Patient Instructions (Signed)

## 2018-08-20 LAB — GC/CHLAMYDIA PROBE AMP
Chlamydia trachomatis, NAA: NEGATIVE
Neisseria gonorrhoeae by PCR: NEGATIVE

## 2018-09-06 ENCOUNTER — Encounter: Payer: Self-pay | Admitting: Obstetrics and Gynecology

## 2018-09-06 ENCOUNTER — Other Ambulatory Visit: Payer: Self-pay

## 2018-09-06 ENCOUNTER — Telehealth: Payer: Self-pay | Admitting: Obstetrics and Gynecology

## 2018-09-06 ENCOUNTER — Ambulatory Visit: Payer: BC Managed Care – PPO | Admitting: Obstetrics and Gynecology

## 2018-09-06 VITALS — BP 110/78 | HR 64 | Temp 99.0°F | Wt 148.3 lb

## 2018-09-06 DIAGNOSIS — R350 Frequency of micturition: Secondary | ICD-10-CM

## 2018-09-06 DIAGNOSIS — N3090 Cystitis, unspecified without hematuria: Secondary | ICD-10-CM

## 2018-09-06 LAB — POCT URINALYSIS DIPSTICK
Bilirubin, UA: NEGATIVE
Blood, UA: POSITIVE
Glucose, UA: NEGATIVE
Ketones, UA: POSITIVE
Nitrite, UA: NEGATIVE
Protein, UA: NEGATIVE
Spec Grav, UA: 1.01 (ref 1.010–1.025)
Urobilinogen, UA: 0.2 E.U./dL
pH, UA: 5 (ref 5.0–8.0)

## 2018-09-06 MED ORDER — SULFAMETHOXAZOLE-TRIMETHOPRIM 800-160 MG PO TABS: 1.0000 | ORAL_TABLET | Freq: Two times a day (BID) | ORAL | 0 refills | Status: DC

## 2018-09-06 MED ORDER — PHENAZOPYRIDINE HCL 200 MG PO TABS: 200.0000 mg | ORAL_TABLET | Freq: Three times a day (TID) | ORAL | 0 refills | Status: DC | PRN

## 2018-09-06 NOTE — Telephone Encounter (Signed)
Patient is having UTI symptoms and would an appointment today if possible. To triage to assist with scheduling with Dr.Jertson.

## 2018-09-06 NOTE — Telephone Encounter (Signed)
Spoke with patient. Patient states that she is having urinary urgency, frequency, lower back pain, and blood in urine. Denies fever or chills. Appointment scheduled for today at 2:15 pm with Dr.Jertson. Patient is agreeable to date and time. Advised if symptoms worsen will need to be seen earlier at urgent care or local ER. Patient is agreeable. Encounter closed.

## 2018-09-06 NOTE — Patient Instructions (Signed)

## 2018-09-06 NOTE — Progress Notes (Signed)
GYNECOLOGY  VISIT   HPI: 37 y.o.   Single Unavailable Not Hispanic or Latino  female   G1P1001 with No LMP recorded. (Menstrual status: IUD).   here for urinary urgency, frequency, lower back pain, and blood in urine. Denies chills and fever. Symptoms began Thursday 09/02/2018, started to get bad last night with frequency and urgency and lower back aching. She is seeing blood in the toilet. No fevers or flank pain.  She had a UTI in 10/19, symptoms of a UTI in 11/19 with a negative culture. UTI in 7/19. She hasn't noticed an association with intercourse and UTI's.   GYNECOLOGIC HISTORY: No LMP recorded. (Menstrual status: IUD). Contraception: IUD Menopausal hormone therapy: None        OB History    Gravida  1   Para  1   Term  1   Preterm  0   AB  0   Living  1     SAB  0   TAB  0   Ectopic  0   Multiple  0   Live Births  1              There are no active problems to display for this patient.   Past Medical History:  Diagnosis Date  . Abnormal Pap smear of cervix ~2002 & ~ 2006   COLPO biopsy with first abnormal  . Anxiety   . Depression   . History of IBS     Past Surgical History:  Procedure Laterality Date  . AUGMENTATION MAMMAPLASTY     implants  . BREAST SURGERY    . CESAREAN SECTION  01/1999  . CYST EXCISION  2006   seb. cyst upper back   . CYST EXCISION Left 2010   left buttock  . INTRAUTERINE DEVICE INSERTION  08/22/13   Mirena, first Mirena inserted 08/2008  . LAPAROSCOPIC CHOLECYSTECTOMY    . REFRACTIVE SURGERY      Current Outpatient Medications  Medication Sig Dispense Refill  . betamethasone valerate ointment (VALISONE) 0.1 % Apply a pea sized amount topically BID for 1-2 weeks as needed 15 g 0  . DULoxetine (CYMBALTA) 60 MG capsule Take 1 capsule by mouth daily.    Marland Kitchen. levonorgestrel (MIRENA) 20 MCG/24HR IUD 1 each by Intrauterine route once.    Marland Kitchen. LINZESS 290 MCG CAPS capsule Take 1 tablet by mouth daily.     No current  facility-administered medications for this visit.      ALLERGIES: Percocet [oxycodone-acetaminophen]  Family History  Problem Relation Age of Onset  . Cancer Father [id number]       Prostate   . Hypertension Maternal Grandmother   . Cancer Maternal Grandfather     Social History   Socioeconomic History  . Marital status: Single    Spouse name: Not on file  . Number of children: Not on file  . Years of education: Not on file  . Highest education level: Not on file  Occupational History  . Not on file  Social Needs  . Financial resource strain: Not on file  . Food insecurity:    Worry: Not on file    Inability: Not on file  . Transportation needs:    Medical: Not on file    Non-medical: Not on file  Tobacco Use  . Smoking status: Never Smoker  . Smokeless tobacco: Never Used  Substance and Sexual Activity  . Alcohol use: Yes    Alcohol/week: 1.0 standard drinks    Types:  1 Glasses of wine per week  . Drug use: No  . Sexual activity: Yes    Partners: Male    Birth control/protection: I.U.D.    Comment: Mirena inserted 08/22/13  Lifestyle  . Physical activity:    Days per week: Not on file    Minutes per session: Not on file  . Stress: Not on file  Relationships  . Social connections:    Talks on phone: Not on file    Gets together: Not on file    Attends religious service: Not on file    Active member of club or organization: Not on file    Attends meetings of clubs or organizations: Not on file    Relationship status: Not on file  . Intimate partner violence:    Fear of current or ex partner: Not on file    Emotionally abused: Not on file    Physically abused: Not on file    Forced sexual activity: Not on file  Other Topics Concern  . Not on file  Social History Narrative  . Not on file    Review of Systems  Constitutional: Negative.   HENT: Negative.   Eyes: Negative.   Respiratory: Negative.   Cardiovascular: Negative.   Gastrointestinal: Positive for  abdominal pain.  Genitourinary: Positive for dysuria, frequency and urgency.       Nocturia Blood in urine  Musculoskeletal: Positive for back pain.  Skin: Negative.   Neurological: Negative.   Endo/Heme/Allergies: Negative.   Psychiatric/Behavioral: Negative.     PHYSICAL EXAMINATION:    BP 110/78 (BP Location: Right Arm, Patient Position: Sitting, Cuff Size: Normal)   Pulse 64   Temp 99 F (37.2 C)   Wt 148 lb 4.8 oz (67.3 kg)   BMI 25.46 kg/m     General appearance: alert, cooperative and appears stated age Abdomen: soft, non-tender; non distended, no masses,  no organomegaly CVA: not tender  Urine dip: + blood, 2+ leuk  ASSESSMENT Cystitis, recurrent.     PLAN Bactrim and pyridium Send urine for ua, c&s If she gets another UTI within the next few months will send to Urology Discussed voiding prior to and after intercourse. Call with fevers, flank pain or any other concern    An After Visit Summary was printed and given to the patient.

## 2018-09-07 LAB — URINALYSIS, MICROSCOPIC ONLY
Casts: NONE SEEN /lpf
RBC, UA: >30 /hpf — AB (ref 0–2)
WBC, UA: >30 /hpf — AB (ref 0–5)

## 2018-09-08 LAB — URINE CULTURE

## 2018-09-13 NOTE — Progress Notes (Signed)
GYNECOLOGY  VISIT   HPI: 38 y.o.   Single Unavailable Not Hispanic or Latino  female   G1P1001 with No LMP recorded. (Menstrual status: IUD).   here for IUD check. She had a mirena IUD exchange in 12/19. No complaints.   GYNECOLOGIC HISTORY: No LMP recorded. (Menstrual status: IUD). Contraception:Mirena IUD Menopausal hormone therapy: None        OB History    Gravida  1   Para  1   Term  1   Preterm  0   AB  0   Living  1     SAB  0   TAB  0   Ectopic  0   Multiple  0   Live Births  1              There are no active problems to display for this patient.   Past Medical History:  Diagnosis Date  . Abnormal Pap smear of cervix ~2002 & ~ 2006   COLPO biopsy with first abnormal  . Anxiety   . Depression   . History of IBS     Past Surgical History:  Procedure Laterality Date  . AUGMENTATION MAMMAPLASTY     implants  . BREAST SURGERY    . CESAREAN SECTION  01/1999  . CYST EXCISION  2006   seb. cyst upper back   . CYST EXCISION Left 2010   left buttock  . INTRAUTERINE DEVICE INSERTION  08/22/13   Mirena, first Mirena inserted 08/2008  . LAPAROSCOPIC CHOLECYSTECTOMY    . REFRACTIVE SURGERY      Current Outpatient Medications  Medication Sig Dispense Refill  . betamethasone valerate ointment (VALISONE) 0.1 % Apply a pea sized amount topically BID for 1-2 weeks as needed 15 g 0  . DULoxetine (CYMBALTA) 60 MG capsule Take 1 capsule by mouth daily.    Marland Kitchen levonorgestrel (MIRENA) 20 MCG/24HR IUD 1 each by Intrauterine route once.    Marland Kitchen LINZESS 290 MCG CAPS capsule Take 1 tablet by mouth daily.     No current facility-administered medications for this visit.      ALLERGIES: Percocet [oxycodone-acetaminophen]  Family History  Problem Relation Age of Onset  . Cancer Father 3       Prostate   . Hypertension Maternal Grandmother   . Cancer Maternal Grandfather     Social History   Socioeconomic History  . Marital status: Single    Spouse name:  Not on file  . Number of children: Not on file  . Years of education: Not on file  . Highest education level: Not on file  Occupational History  . Not on file  Social Needs  . Financial resource strain: Not on file  . Food insecurity:    Worry: Not on file    Inability: Not on file  . Transportation needs:    Medical: Not on file    Non-medical: Not on file  Tobacco Use  . Smoking status: Never Smoker  . Smokeless tobacco: Never Used  Substance and Sexual Activity  . Alcohol use: Yes    Alcohol/week: 1.0 standard drinks    Types: 1 Glasses of wine per week  . Drug use: No  . Sexual activity: Yes    Partners: Male    Birth control/protection: I.U.D.    Comment: Mirena inserted 08/22/13  Lifestyle  . Physical activity:    Days per week: Not on file    Minutes per session: Not on file  . Stress:  Not on file  Relationships  . Social connections:    Talks on phone: Not on file    Gets together: Not on file    Attends religious service: Not on file    Active member of club or organization: Not on file    Attends meetings of clubs or organizations: Not on file    Relationship status: Not on file  . Intimate partner violence:    Fear of current or ex partner: Not on file    Emotionally abused: Not on file    Physically abused: Not on file    Forced sexual activity: Not on file  Other Topics Concern  . Not on file  Social History Narrative  . Not on file    Review of Systems  Constitutional: Negative.   HENT: Negative.   Eyes: Negative.   Respiratory: Negative.   Cardiovascular: Negative.   Gastrointestinal: Negative.   Genitourinary: Negative.   Musculoskeletal: Negative.   Skin: Negative.   Neurological: Negative.   Endo/Heme/Allergies: Negative.   Psychiatric/Behavioral: Negative.     PHYSICAL EXAMINATION:    BP 118/78 (BP Location: Right Arm, Patient Position: Sitting, Cuff Size: Normal)   Pulse 88   Wt 148 lb (67.1 kg)   BMI 25.40 kg/m     General  appearance: alert, cooperative and appears stated age Abdomen: soft, non-tender; non distended, no masses,  no organomegaly  Pelvic: External genitalia:  no lesions              Urethra:  normal appearing urethra with no masses, tenderness or lesions              Bartholins and Skenes: normal                 Vagina: normal appearing vagina with normal color and discharge, no lesions              Cervix: no cervical motion tenderness, no lesions and IUD string 3 cm              Bimanual Exam:  Uterus:  normal size, contour, position, consistency, mobility, non-tender              Adnexa: no mass, fullness, tenderness                Chaperone was present for exam.  ASSESSMENT IUD check, doing well    PLAN Routine f/u   An After Visit Summary was printed and given to the patient.

## 2018-09-15 ENCOUNTER — Encounter: Payer: Self-pay | Admitting: Obstetrics and Gynecology

## 2018-09-15 ENCOUNTER — Other Ambulatory Visit: Payer: Self-pay

## 2018-09-15 ENCOUNTER — Ambulatory Visit: Payer: BC Managed Care – PPO | Admitting: Obstetrics and Gynecology

## 2018-09-15 VITALS — BP 118/78 | HR 88 | Wt 148.0 lb

## 2018-09-15 DIAGNOSIS — Z30431 Encounter for routine checking of intrauterine contraceptive device: Secondary | ICD-10-CM | POA: Diagnosis not present

## 2018-12-16 ENCOUNTER — Telehealth: Payer: Self-pay

## 2018-12-16 NOTE — Telephone Encounter (Signed)
Spoke with patient. Patient states that she is having UTI symptoms. Having urinary frequency, urgency, and odor. Denies any dysuria, back pain, fever, or chills. Had blood in her urine x 2 days. No blood in urine today. Offered OV today with Dr.Jertson, but patient declines. Appointment scheduled for tomorrow at 11 am with Dr.Silva. Aware if symptoms worsen or develops new symptoms needs to be seen earlier with local urgent care after hours if needed. Patient verbalizes understanding. Encounter closed.

## 2018-12-17 ENCOUNTER — Other Ambulatory Visit: Payer: Self-pay

## 2018-12-17 ENCOUNTER — Ambulatory Visit: Payer: BC Managed Care – PPO | Admitting: Obstetrics and Gynecology

## 2018-12-17 ENCOUNTER — Encounter: Payer: Self-pay | Admitting: Obstetrics and Gynecology

## 2018-12-17 VITALS — BP 110/68 | HR 68 | Temp 98.1°F | Resp 16 | Wt 144.0 lb

## 2018-12-17 DIAGNOSIS — Z87440 Personal history of urinary (tract) infections: Secondary | ICD-10-CM

## 2018-12-17 DIAGNOSIS — N898 Other specified noninflammatory disorders of vagina: Secondary | ICD-10-CM | POA: Diagnosis not present

## 2018-12-17 DIAGNOSIS — N390 Urinary tract infection, site not specified: Secondary | ICD-10-CM | POA: Diagnosis not present

## 2018-12-17 LAB — POCT URINALYSIS DIPSTICK
Bilirubin, UA: NEGATIVE
Blood, UA: NEGATIVE
Glucose, UA: NEGATIVE
Ketones, UA: NEGATIVE
Leukocytes, UA: NEGATIVE
Nitrite, UA: NEGATIVE
Protein, UA: NEGATIVE
Urobilinogen, UA: NEGATIVE E.U./dL — AB
pH, UA: 5 (ref 5.0–8.0)

## 2018-12-17 MED ORDER — SULFAMETHOXAZOLE-TRIMETHOPRIM 800-160 MG PO TABS: 1.0000 | ORAL_TABLET | Freq: Two times a day (BID) | ORAL | 0 refills | Status: DC

## 2018-12-17 NOTE — Addendum Note (Signed)
Addended by: Eliezer Bottom on: 12/17/2018 12:33 PM   Modules accepted: Orders

## 2018-12-17 NOTE — Progress Notes (Signed)
GYNECOLOGY  VISIT   HPI: 38 y.o.   Single  Native American  female   G1P1001 with No LMP recorded. (Menstrual status: IUD).   here for uti.   One week ago had blood with urination.  Two days ago, bleeding stopped, but still has some urgency.  Now noticing odor with urination.  No dysuria.  No nausea, vomiting, or back pain.  No fevers.  States she usually has a UTI or it is BV.  Has Mirena and does not have bleeding at all.   E Coli UTI in December 2019.  Tx with Bactrim DS.  Also had a UTI in March when she was tested for the flu.  Tx with Ciprofloxacin by PCP, Dr. Laurann Montana.  Ultimately, the UC was negative.  Patient pulled it up in her portal on the phone.   Same partner for one year.  Neg GC/CT in Dec. 2019.   Urine - negative.    GYNECOLOGIC HISTORY: No LMP recorded. (Menstrual status: IUD). Contraception:  IUD Menopausal hormone therapy:  none Last mammogram:  none Last pap smear:   02-12-2018 neg HPV HR neg poct urine-neg        OB History    Gravida  1   Para  1   Term  1   Preterm  0   AB  0   Living  1     SAB  0   TAB  0   Ectopic  0   Multiple  0   Live Births  1              There are no active problems to display for this patient.   Past Medical History:  Diagnosis Date  . Abnormal Pap smear of cervix ~2002 & ~ 2006   COLPO biopsy with first abnormal  . Anxiety   . Depression   . History of IBS     Past Surgical History:  Procedure Laterality Date  . AUGMENTATION MAMMAPLASTY     implants  . BREAST SURGERY    . CESAREAN SECTION  01/1999  . CYST EXCISION  2006   seb. cyst upper back   . CYST EXCISION Left 2010   left buttock  . INTRAUTERINE DEVICE INSERTION  08/22/13   Mirena, first Mirena inserted 08/2008  . LAPAROSCOPIC CHOLECYSTECTOMY    . REFRACTIVE SURGERY      Current Outpatient Medications  Medication Sig Dispense Refill  . DULoxetine (CYMBALTA) 60 MG capsule Take 1 capsule by mouth daily.    Marland Kitchen  levonorgestrel (MIRENA) 20 MCG/24HR IUD 1 each by Intrauterine route once.    Marland Kitchen LINZESS 72 MCG capsule TAKE 1 CAPSULE BY MOUTH ONCE DAILY FOR 90 DAYS     No current facility-administered medications for this visit.      ALLERGIES: Percocet [oxycodone-acetaminophen]  Family History  Problem Relation Age of Onset  . Cancer Father 50       Prostate   . Hypertension Maternal Grandmother   . Cancer Maternal Grandfather     Social History   Socioeconomic History  . Marital status: Single    Spouse name: Not on file  . Number of children: Not on file  . Years of education: Not on file  . Highest education level: Not on file  Occupational History  . Not on file  Social Needs  . Financial resource strain: Not on file  . Food insecurity:    Worry: Not on file    Inability: Not  on file  . Transportation needs:    Medical: Not on file    Non-medical: Not on file  Tobacco Use  . Smoking status: Never Smoker  . Smokeless tobacco: Never Used  Substance and Sexual Activity  . Alcohol use: Yes    Alcohol/week: 1.0 standard drinks    Types: 1 Glasses of wine per week  . Drug use: No  . Sexual activity: Yes    Partners: Male    Birth control/protection: I.U.D.  Lifestyle  . Physical activity:    Days per week: Not on file    Minutes per session: Not on file  . Stress: Not on file  Relationships  . Social connections:    Talks on phone: Not on file    Gets together: Not on file    Attends religious service: Not on file    Active member of club or organization: Not on file    Attends meetings of clubs or organizations: Not on file    Relationship status: Not on file  . Intimate partner violence:    Fear of current or ex partner: Not on file    Emotionally abused: Not on file    Physically abused: Not on file    Forced sexual activity: Not on file  Other Topics Concern  . Not on file  Social History Narrative  . Not on file    Review of Systems  Constitutional:  Negative.   HENT: Negative.   Eyes: Negative.   Respiratory: Negative.   Cardiovascular: Negative.   Gastrointestinal: Negative.   Endocrine: Negative.   Genitourinary: Positive for urgency.       Urine odor, blood in urine a few days ago  Musculoskeletal: Negative.   Skin: Negative.   Allergic/Immunologic: Negative.   Neurological: Negative.   Hematological: Negative.   Psychiatric/Behavioral: Negative.     PHYSICAL EXAMINATION:    BP 110/68   Pulse 68   Temp 98.1 F (36.7 C) (Oral)   Resp 16   Wt 144 lb (65.3 kg)   BMI 24.72 kg/m     General appearance: alert, cooperative and appears stated age   Pelvic: External genitalia:  no lesions              Urethra:  normal appearing urethra with no masses, tenderness or lesions              Bartholins and Skenes: normal                 Vagina: normal appearing vagina with normal color and discharge, no lesions              Cervix: no lesions.  IUD strings noted.                 Bimanual Exam:  Uterus:  normal size, contour, position, consistency, mobility, non-tender              Adnexa: no mass, fullness, tenderness          Chaperone was present for exam.  ASSESSMENT  Hx UTIs. Recent hematuria? Vaginal odor.  PLAN  Will send UC.  Affirm.  Will wait for results to come back before doing any treatment.  Patient ok with this.    An After Visit Summary was printed and given to the patient.  __15____ minutes face to face time of which over 50% was spent in counseling.

## 2018-12-18 LAB — URINE CULTURE

## 2018-12-18 LAB — VAGINITIS/VAGINOSIS, DNA PROBE
Candida Species: NEGATIVE
Gardnerella vaginalis: POSITIVE — AB
Trichomonas vaginosis: NEGATIVE

## 2018-12-20 ENCOUNTER — Telehealth: Payer: Self-pay

## 2018-12-20 MED ORDER — METRONIDAZOLE 0.75 % VA GEL: 1.0000 | Freq: Every day | VAGINAL | 0 refills | Status: DC

## 2018-12-20 NOTE — Telephone Encounter (Signed)
-----   Message from Patton Salles, MD sent at 12/19/2018  8:02 AM EDT ----- Please inform of Affirm result showing bacterial vaginosis. She may treat with Flagyl 500 mg po bid for 7 days or Metrogel pv at hs for 5 nights.  Please send Rx to pharmacy of choice. ETOH precautions.   Her urine culture is negative for infection.

## 2018-12-20 NOTE — Telephone Encounter (Signed)
Spoke with patient. Results given. Patient verbalizes understanding. Rx for Metrogel pv at hs for 5 nights sent to pharmacy on file. Encounter closed.

## 2019-02-21 ENCOUNTER — Other Ambulatory Visit: Payer: Self-pay

## 2019-02-23 ENCOUNTER — Ambulatory Visit: Payer: BC Managed Care – PPO | Admitting: Obstetrics and Gynecology

## 2019-02-23 NOTE — Progress Notes (Deleted)
38 y.o. G1P1001 Single Unavailable Not Hispanic or Latino female here for annual exam.      No LMP recorded. (Menstrual status: IUD).          Sexually active: {yes no:314532}  The current method of family planning is {contraception:315051}.    Exercising: {yes no:314532}  {types:19826} Smoker:  {YES [id number]:22349}  Health Maintenance: Pap: 02/12/2018 WNL NEG HPV, 09-21-14 Neg:Neg HR HPV, 02-04-13 Neg History of abnormal Pap:  Yes--had neg colpo 2002 and 2006 MMG:  n/a Colonoscopy:  2006 normal (done for IBS) BMD:   n/a TDaP:  03-08-09 Gardasil: completed 2   reports that she has never smoked. She has never used smokeless tobacco. She reports current alcohol use of about 1.0 standard drinks of alcohol per week. She reports that she does not use drugs.  Past Medical History:  Diagnosis Date  . Abnormal Pap smear of cervix ~2002 & ~ 2006   COLPO biopsy with first abnormal  . Anxiety   . Depression   . History of IBS     Past Surgical History:  Procedure Laterality Date  . AUGMENTATION MAMMAPLASTY     implants  . BREAST SURGERY    . CESAREAN SECTION  01/1999  . CYST EXCISION  2006   seb. cyst upper back   . CYST EXCISION Left 2010   left buttock  . INTRAUTERINE DEVICE INSERTION  08/22/13   Mirena, first Mirena inserted 08/2008  . LAPAROSCOPIC CHOLECYSTECTOMY    . REFRACTIVE SURGERY      Current Outpatient Medications  Medication Sig Dispense Refill  . DULoxetine (CYMBALTA) 60 MG capsule Take 1 capsule by mouth daily.    Marland Kitchen. levonorgestrel (MIRENA) 20 MCG/24HR IUD 1 each by Intrauterine route once.    Marland Kitchen. LINZESS 72 MCG capsule TAKE 1 CAPSULE BY MOUTH ONCE DAILY FOR 90 DAYS    . metroNIDAZOLE (METROGEL) 0.75 % vaginal gel Place 1 Applicatorful vaginally at bedtime. X 5 nights 70 g 0   No current facility-administered medications for this visit.     Family History  Problem Relation Age of Onset  . Cancer Father [id number]       Prostate   . Hypertension Maternal Grandmother   . Cancer  Maternal Grandfather     Review of Systems  Exam:   There were no vitals taken for this visit.  Weight change: @WEIGHTCHANGE @ Height:      Ht Readings from Last 3 Encounters:  07/12/18 5\' 4"  (1.626 m)  04/14/18 5\' 4"  (1.626 m)  03/10/18 5\' 4"  (1.626 m)    General appearance: alert, cooperative and appears stated age Head: Normocephalic, without obvious abnormality, atraumatic Neck: no adenopathy, supple, symmetrical, trachea midline and thyroid {CHL AMB PHY EX THYROID NORM DEFAULT:[phone]::"normal to inspection and palpation"} Lungs: clear to auscultation bilaterally Cardiovascular: regular rate and rhythm Breasts: {Exam; breast:13139::"normal appearance, no masses or tenderness"} Abdomen: soft, non-tender; non distended,  no masses,  no organomegaly Extremities: extremities normal, atraumatic, no cyanosis or edema Skin: Skin color, texture, turgor normal. No rashes or lesions Lymph nodes: Cervical, supraclavicular, and axillary nodes normal. No abnormal inguinal nodes palpated Neurologic: Grossly normal   Pelvic: External genitalia:  no lesions              Urethra:  normal appearing urethra with no masses, tenderness or lesions              Bartholins and Skenes: normal  Vagina: normal appearing vagina with normal color and discharge, no lesions              Cervix: {CHL AMB PHY EX CERVIX NORM DEFAULT:[phone]::"no lesions"}               Bimanual Exam:  Uterus:  {CHL AMB PHY EX UTERUS NORM DEFAULT:[phone]::"normal size, contour, position, consistency, mobility, non-tender"}              Adnexa: {CHL AMB PHY EX ADNEXA NO MASS [phone]::"no mass, fullness, tenderness"}               Rectovaginal: Confirms               Anus:  normal sphincter tone, no lesions  Chaperone was present for exam.  A:  Well Woman with normal exam  P:

## 2019-03-30 NOTE — Progress Notes (Signed)
38 y.o. G1P1001 Single Unavailable Not Hispanic or Latino female here for annual exam.  Wants to discuss IUD removal. No complaints. She would like the IUD removed to get pregnant. Same partner for almost 2 years, essentially live together.    No LMP recorded. (Menstrual status: IUD).          Sexually active: Yes.    The current method of family planning is IUD.    Exercising: Yes.    walking, jogging Smoker:  no  Health Maintenance: Pap: 02/12/2018 WNL NEG HPV, 09-21-14 Neg:Neg HR HPV, 02-04-13 Neg History of abnormal Pap:  Yes--had neg colpo 2002 and 2006 MMG:  n/a Colonoscopy:  2006 normal (done for IBS) BMD:   n/a TDaP:  03-08-09 Gardasil: Received 2   reports that she has never smoked. She has never used smokeless tobacco. She reports current alcohol use. She reports that she does not use drugs. She is drinking 2 glasses of wine daily. She is a [employer] [place]. Daughter is 49  Past Medical History:  Diagnosis Date  . Abnormal Pap smear of cervix ~2002 & ~ 2006   COLPO biopsy with first abnormal  . Anxiety   . Depression   . History of IBS     Past Surgical History:  Procedure Laterality Date  . AUGMENTATION MAMMAPLASTY     implants  . BREAST SURGERY    . CESAREAN SECTION  01/1999  . CYST EXCISION  2006   seb. cyst upper back   . CYST EXCISION Left 2010   left buttock  . INTRAUTERINE DEVICE INSERTION  08/22/13   Mirena, first Mirena inserted 08/2008  . LAPAROSCOPIC CHOLECYSTECTOMY    . REFRACTIVE SURGERY      Current Outpatient Medications  Medication Sig Dispense Refill  . DULoxetine (CYMBALTA) 60 MG capsule Take 1 capsule by mouth daily.    Marland Kitchen levonorgestrel (MIRENA) 20 MCG/24HR IUD 1 each by Intrauterine route once.    Marland Kitchen LINZESS 72 MCG capsule TAKE 1 CAPSULE BY MOUTH ONCE DAILY FOR 90 DAYS     No current facility-administered medications for this visit.     Family History  Problem Relation Age of Onset  . Cancer Father 6       Prostate   . Hypertension  Maternal Grandmother   . Cancer Maternal Grandfather     Review of Systems  Constitutional: Negative.   HENT: Negative.   Eyes: Negative.   Respiratory: Negative.   Cardiovascular: Negative.   Gastrointestinal: Negative.   Endocrine: Negative.   Genitourinary: Negative.   Musculoskeletal: Negative.   Skin: Negative.   Allergic/Immunologic: Negative.   Neurological: Negative.   Hematological: Negative.   Psychiatric/Behavioral: Negative.     Exam:   BP 110/80 (BP Location: Right Arm, Patient Position: Sitting, Cuff Size: Normal)   Pulse 88   Temp 98.2 F (36.8 C) (Skin)   Ht 5' 5.35" (1.66 m)   Wt 145 lb 9.6 oz (66 kg)   BMI 23.97 kg/m   Weight change: @WEIGHTCHANGE @ Height:   Height: 5' 5.35" (166 cm)  Ht Readings from Last 3 Encounters:  04/04/19 5' 5.35" (1.66 m)  07/12/18 5\' 4"  (1.626 m)  04/14/18 5\' 4"  (1.626 m)    General appearance: alert, cooperative and appears stated age Head: Normocephalic, without obvious abnormality, atraumatic Neck: no adenopathy, supple, symmetrical, trachea midline and thyroid normal to inspection and palpation Lungs: clear to auscultation bilaterally Cardiovascular: regular rate and rhythm Breasts: normal appearance, no masses or tenderness, bilateral implants  Abdomen: soft, non-tender; non distended,  no masses,  no organomegaly Extremities: extremities normal, atraumatic, no cyanosis or edema Skin: Skin color, texture, turgor normal. No rashes or lesions Lymph nodes: Cervical, supraclavicular, and axillary nodes normal. No abnormal inguinal nodes palpated Neurologic: Grossly normal   Pelvic: External genitalia:  no lesions              Urethra:  normal appearing urethra with no masses, tenderness or lesions              Bartholins and Skenes: normal                 Vagina: normal appearing vagina with normal color and discharge, no lesions              Cervix: no lesions and IUD string 3-4 cm. IUD removed with ringed forceps.                Bimanual Exam:  Uterus:  normal size, contour, position, consistency, mobility, non-tender              Adnexa: no mass, fullness, tenderness               Rectovaginal: Confirms               Anus:  normal sphincter tone, no lesions  Chaperone was present for exam.  A:  Well Woman with normal exam  Contraception, desires IUD removal to get pregnant  P:   IUD removed  Start PNV  Information on good health prior to pregnancy and AMA given.   Screening labs, rubella screen  TDAP  Discussed breast self exam  Discussed calcium and vit D intake  Discussed no ETOH if she could be pregnant  No pap this year  Mammogram next year

## 2019-03-31 ENCOUNTER — Other Ambulatory Visit: Payer: Self-pay

## 2019-04-04 ENCOUNTER — Encounter: Payer: Self-pay | Admitting: Obstetrics and Gynecology

## 2019-04-04 ENCOUNTER — Other Ambulatory Visit: Payer: Self-pay

## 2019-04-04 ENCOUNTER — Ambulatory Visit: Payer: BC Managed Care – PPO | Admitting: Obstetrics and Gynecology

## 2019-04-04 VITALS — BP 110/80 | HR 88 | Temp 98.2°F | Ht 65.35 in | Wt 145.6 lb

## 2019-04-04 DIAGNOSIS — Z30432 Encounter for removal of intrauterine contraceptive device: Secondary | ICD-10-CM | POA: Diagnosis not present

## 2019-04-04 DIAGNOSIS — Z0000 Encounter for general adult medical examination without abnormal findings: Secondary | ICD-10-CM | POA: Diagnosis not present

## 2019-04-04 DIAGNOSIS — Z01419 Encounter for gynecological examination (general) (routine) without abnormal findings: Secondary | ICD-10-CM

## 2019-04-04 DIAGNOSIS — Z3169 Encounter for other general counseling and advice on procreation: Secondary | ICD-10-CM | POA: Diagnosis not present

## 2019-04-04 DIAGNOSIS — Z23 Encounter for immunization: Secondary | ICD-10-CM | POA: Diagnosis not present

## 2019-04-04 DIAGNOSIS — Z30431 Encounter for routine checking of intrauterine contraceptive device: Secondary | ICD-10-CM | POA: Diagnosis not present

## 2019-04-04 NOTE — Patient Instructions (Addendum)
EXERCISE AND DIET:  We recommended that you start or continue a regular exercise program for good health. Regular exercise means any activity that makes your heart beat faster and makes you sweat.  We recommend exercising at least 30 minutes per day at least 3 days a week, preferably 4 or 5.  We also recommend a diet low in fat and sugar.  Inactivity, poor dietary choices and obesity can cause diabetes, heart attack, stroke, and kidney damage, among others.    ALCOHOL AND SMOKING:  Women should limit their alcohol intake to no more than 7 drinks/beers/glasses of wine (combined, not each!) per week. Moderation of alcohol intake to this level decreases your risk of breast cancer and liver damage. And of course, no recreational drugs are part of a healthy lifestyle.  And absolutely no smoking or even second hand smoke. Most people know smoking can cause heart and lung diseases, but did you know it also contributes to weakening of your bones? Aging of your skin?  Yellowing of your teeth and nails?  CALCIUM AND VITAMIN D:  Adequate intake of calcium and Vitamin D are recommended.  The recommendations for exact amounts of these supplements seem to change often, but generally speaking 1,000 mg of calcium (between diet and supplement) and 800 units of Vitamin D per day seems prudent. Certain women may benefit from higher intake of Vitamin D.  If you are among these women, your doctor will have told you during your visit.    PAP SMEARS:  Pap smears, to check for cervical cancer or precancers,  have traditionally been done yearly, although recent scientific advances have shown that most women can have pap smears less often.  However, every woman still should have a physical exam from her gynecologist every year. It will include a breast check, inspection of the vulva and vagina to check for abnormal growths or skin changes, a visual exam of the cervix, and then an exam to evaluate the size and shape of the uterus and  ovaries.  And after 40 years of age, a rectal exam is indicated to check for rectal cancers. We will also provide age appropriate advice regarding health maintenance, like when you should have certain vaccines, screening for sexually transmitted diseases, bone density testing, colonoscopy, mammograms, etc.   MAMMOGRAMS:  All women over 40 years old should have a yearly mammogram. Many facilities now offer a "3D" mammogram, which may cost around $50 extra out of pocket. If possible,  we recommend you accept the option to have the 3D mammogram performed.  It both reduces the number of women who will be called back for extra views which then turn out to be normal, and it is better than the routine mammogram at detecting truly abnormal areas.    COLON CANCER SCREENING: Now recommend starting at age 45. At this time colonoscopy is not covered for routine screening until 50. There are take home tests that can be done between 45-49.   COLONOSCOPY:  Colonoscopy to screen for colon cancer is recommended for all women at age 50.  We know, you hate the idea of the prep.  We agree, BUT, having colon cancer and not knowing it is worse!!  Colon cancer so often starts as a polyp that can be seen and removed at colonscopy, which can quite literally save your life!  And if your first colonoscopy is normal and you have no family history of colon cancer, most women don't have to have it again for   10 years.  Once every ten years, you can do something that may end up saving your life, right?  We will be happy to help you get it scheduled when you are ready.  Be sure to check your insurance coverage so you understand how much it will cost.  It may be covered as a preventative service at no cost, but you should check your particular policy.      Breast Self-Awareness Breast self-awareness means being familiar with how your breasts look and feel. It involves checking your breasts regularly and reporting any changes to your  health care provider. Practicing breast self-awareness is important. A change in your breasts can be a sign of a serious medical problem. Being familiar with how your breasts look and feel allows you to find any problems early, when treatment is more likely to be successful. All women should practice breast self-awareness, including women who have had breast implants. How to do a breast self-exam One way to learn what is normal for your breasts and whether your breasts are changing is to do a breast self-exam. To do a breast self-exam: Look for Changes  1. Remove all the clothing above your waist. 2. Stand in front of a mirror in a room with good lighting. 3. Put your hands on your hips. 4. Push your hands firmly downward. 5. Compare your breasts in the mirror. Look for differences between them (asymmetry), such as: ? Differences in shape. ? Differences in size. ? Puckers, dips, and bumps in one breast and not the other. 6. Look at each breast for changes in your skin, such as: ? Redness. ? Scaly areas. 7. Look for changes in your nipples, such as: ? Discharge. ? Bleeding. ? Dimpling. ? Redness. ? A change in position. Feel for Changes Carefully feel your breasts for lumps and changes. It is best to do this while lying on your back on the floor and again while sitting or standing in the shower or tub with soapy water on your skin. Feel each breast in the following way:  Place the arm on the side of the breast you are examining above your head.  Feel your breast with the other hand.  Start in the nipple area and make  inch (2 cm) overlapping circles to feel your breast. Use the pads of your three middle fingers to do this. Apply light pressure, then medium pressure, then firm pressure. The light pressure will allow you to feel the tissue closest to the skin. The medium pressure will allow you to feel the tissue that is a little deeper. The firm pressure will allow you to feel the tissue  close to the ribs.  Continue the overlapping circles, moving downward over the breast until you feel your ribs below your breast.  Move one finger-width toward the center of the body. Continue to use the  inch (2 cm) overlapping circles to feel your breast as you move slowly up toward your collarbone.  Continue the up and down exam using all three pressures until you reach your armpit.  Write Down What You Find  Write down what is normal for each breast and any changes that you find. Keep a written record with breast changes or normal findings for each breast. By writing this information down, you do not need to depend only on memory for size, tenderness, or location. Write down where you are in your menstrual cycle, if you are still menstruating. If you are having trouble noticing differences   in your breasts, do not get discouraged. With time you will become more familiar with the variations in your breasts and more comfortable with the exam. How often should I examine my breasts? Examine your breasts every month. If you are breastfeeding, the best time to examine your breasts is after a feeding or after using a breast pump. If you menstruate, the best time to examine your breasts is 5-7 days after your period is over. During your period, your breasts are lumpier, and it may be more difficult to notice changes. When should I see my health care provider? See your health care provider if you notice:  A change in shape or size of your breasts or nipples.  A change in the skin of your breast or nipples, such as a reddened or scaly area.  Unusual discharge from your nipples.  A lump or thick area that was not there before.  Pain in your breasts.  Anything that concerns you.   Pregnancy After Age 47 Women who become pregnant after the age of 65 have a higher risk for certain problems during pregnancy. This is because older women may already have health problems before becoming pregnant.  Older women who are healthy before pregnancy may still develop problems during pregnancy. These problems may affect the mother, the unborn baby (fetus), or both. What are the risks for me? If you are over age 36 and you want to become pregnant or are pregnant, you may have a higher risk of:  Not being able to get pregnant (infertility).  Going into labor early (preterm labor).  Needing surgical delivery of your baby (cesarean delivery, or C-section).  Having high blood pressure (hypertension).  Having complications during pregnancy, such as high blood pressure and other symptoms (preeclampsia).  Having diabetes during pregnancy (gestational diabetes).  Being pregnant with more than one baby.  Loss of the unborn baby before 20 weeks (miscarriage) or after 20 weeks of pregnancy (stillbirth). What are the risks for my baby? Babies born to women over the age of 74 have a higher risk for:  Being born early (prematurity).  Low birth weight, which is less than 5 lb, 8 oz (2.5 kg).  Birth defects, such as Down syndrome and cleft palate.  Health complications, including problems with growth and development. How is prenatal care different for women over age 77? All women should see their health care provider before they try to become pregnant. This is especially important for women over the age of 39. Tell your health care provider about:  Any health problems you have.  Any medicines you take.  Any family history of health problems or chromosome-related defects.  Any problems you have had with past pregnancies or deliveries. If you are over age 63 and you plan to become pregnant:  Start taking a daily multivitamin a month or more before you try to get pregnant. Your multivitamin should contain 400 mcg (micrograms) of folic acid. If you are over age 89 and pregnant, make sure you:  Keep taking your multivitamin unless your health care provider tells you not to take it.  Keep all  prenatal visits as told by your health care provider. This is important.  Have ultrasounds regularly throughout your pregnancy to check for problems.  Talk with your health care provider about other prenatal screening tests that you may need. What additional prenatal tests are needed? Screening tests show whether your baby has a higher risk for birth defects than other babies. Screening tests include:  Ultrasound tests to look for markers that indicate a risk for birth defects.  Maternal blood screening. These are blood tests that measure certain substances in your blood to determine your baby's risk for defects. Screening tests do not show whether your baby has or does not have defects. They only show your baby's risk for certain defects. If your screening tests show that risk factors are present, you may need tests to confirm the defect (diagnostic testing). These tests may include:  Chorionic villus sampling. For this procedure, a tissue sample is taken from the organ that forms in your uterus to nourish your baby (placenta). The sample is removed through your cervix or abdomen and tested.  Amniocentesis. For this procedure, a small amount of the fluid that surrounds the baby in the uterus (amniotic fluid) is removed and tested. What can I do to stay healthy during my pregnancy? Staying healthy during pregnancy can help you and your baby to have a lower risk for problems during pregnancy, during delivery, or both. Talk with your health care provider for specific instructions about staying healthy during your pregnancy. Nutrition   At each meal, eat a variety of foods from each of the five food groups. These groups include: ? Proteins such as lean meats, poultry, fish that is low in fat, beans, eggs, and nuts. ? Vegetables such as leafy greens, raw and cooked vegetables, and vegetable juice. ? Fruits that are fresh, frozen, or canned, or 100% fruit juice. ? Dairy products such as low-fat  yogurt, cheese, and milk. ? Whole grains including rice, cereal, pasta, and bread.  Talk with your health care provider about how much food in each group is right for you.  Follow instructions from your health care provider about eating and drinking restrictions during pregnancy. ? Do not eat raw eggs, raw meat, or raw fish or seafood. ? Do not eat any fish that contains high amounts of mercury, such as swordfish or mackerel.  Drink 6-8 or more glasses of water a day. You should drink enough fluid to keep your urine pale yellow. Managing weight gain  Ask your health care provider how much weight gain is healthy during pregnancy.  Stay at a healthy weight. If needed, work with your health care provider to lose weight safely. Activity  Exercise regularly, as directed by your health care provider. Ask your health care provider what forms of exercise are safe for you. General instructions  Do not use any products that contain nicotine or tobacco, such as cigarettes and e-cigarettes. If you need help quitting, ask your health care provider.  Do not drink alcohol, use drugs, or abuse prescription medicine.  Take over-the-counter and prescription medicines only as told by your health care provider.  Do not use hot tubs, steam rooms, or saunas.  Talk with your health care provider about your risk of exposure to harmful environmental conditions. This includes exposure to chemicals, radiation, cleaning products, and cat feces. Follow advice from your health care provider about how to limit your exposure. Summary  Women who become pregnant after the age of 59 have a higher risk for complications during pregnancy.  Problems may affect the mother, the unborn baby (fetus), or both.  All women should see their health care provider before they try to become pregnant. This is especially important for women over the age of 58.  Staying healthy during pregnancy can help both you and your baby to  have a lower risk for some of the problems that  can happen during pregnancy, during delivery, or both. This information is not intended to replace advice given to you by your health care provider. Make sure you discuss any questions you have with your health care provider. Document Released: 12/15/2016 Document Revised: 12/17/2018 Document Reviewed: 12/15/2016 Elsevier Patient Education  2020 [employer]  Preparing for Pregnancy If you are considering becoming pregnant, make an appointment to see your regular health care provider to learn how to prepare for a safe and healthy pregnancy (preconception care). During a preconception care visit, your health care provider will:  Do a complete physical exam, including a Pap test.  Take a complete medical history.  Give you information, answer your questions, and help you resolve problems. Preconception checklist Medical history  Tell your health care provider about any current or past medical conditions. Your pregnancy or your ability to become pregnant may be affected by chronic conditions, such as diabetes, chronic hypertension, and thyroid problems.  Include your family's medical history as well as your partner's medical history.  Tell your health care provider about any history of STIs (sexually transmitted infections).These can affect your pregnancy. In some cases, they can be passed to your baby. Discuss any concerns that you have about STIs.  If indicated, discuss the benefits of genetic testing. This testing will show whether there are any genetic conditions that may be passed from you or your partner to your baby.  Tell your health care provider about: ? Any problems you have had with conception or pregnancy. ? Any medicines you take. These include vitamins, herbal supplements, and over-the-counter medicines. ? Your history of immunizations. Discuss any vaccinations that you may need. Diet  Ask your health care provider what to  include in a healthy diet that has a balance of nutrients. This is especially important when you are pregnant or preparing to become pregnant.  Ask your health care provider to help you reach a healthy weight before pregnancy. ? If you are overweight, you may be at higher risk for certain complications, such as high blood pressure, diabetes, and preterm birth. ? If you are underweight, you are more likely to have a baby who has a low birth weight. Lifestyle, work, and home  Let your health care provider know: ? About any lifestyle habits that you have, such as alcohol use, drug use, or smoking. ? About recreational activities that may put you at risk during pregnancy, such as downhill skiing and certain exercise programs. ? Tell your health care provider about any international travel, especially any travel to places with an active [place] virus outbreak. ? About harmful substances that you may be exposed to at work or at home. These include chemicals, pesticides, radiation, or even litter boxes. ? If you do not feel safe at home. Mental health  Tell your health care provider about: ? Any history of mental health conditions, including feelings of depression, sadness, or anxiety. ? Any medicines that you take for a mental health condition. These include herbs and supplements. Home instructions to prepare for pregnancy Lifestyle   Eat a balanced diet. This includes fresh fruits and vegetables, whole grains, lean meats, low-fat dairy products, healthy fats, and foods that are high in fiber. Ask to meet with a nutritionist or registered dietitian for assistance with meal planning and goals.  Get regular exercise. Try to be active for at least 30 minutes a day on most days of the week. Ask your health care provider which activities are safe during pregnancy.  Do not use any products that contain nicotine or tobacco, such as cigarettes and e-cigarettes. If you need help quitting, ask your health  care provider.  Do not drink alcohol.  Do not take illegal drugs.  Maintain a healthy weight. Ask your health care provider what weight range is right for you. General instructions  Keep an accurate record of your menstrual periods. This makes it easier for your health care provider to determine your baby's due date.  Begin taking prenatal vitamins and folic acid supplements daily as directed by your health care provider.  Manage any chronic conditions, such as high blood pressure and diabetes, as told by your health care provider. This is important. How do I know that I am pregnant? You may be pregnant if you have been sexually active and you miss your period. Symptoms of early pregnancy include:  Mild cramping.  Very light vaginal bleeding (spotting).  Feeling unusually tired.  Nausea and vomiting (morning sickness). If you have any of these symptoms and you suspect that you might be pregnant, you can take a home pregnancy test. These tests check for a hormone in your urine (human chorionic gonadotropin, or hCG). A woman's body begins to make this hormone during early pregnancy. These tests are very accurate. Wait until at least the first day after you miss your period to take one. If the test shows that you are pregnant (you get a positive result), call your health care provider to make an appointment for prenatal care. What should I do if I become pregnant?      Make an appointment with your health care provider as soon as you suspect you are pregnant.  Do not use any products that contain nicotine, such as cigarettes, chewing tobacco, and e-cigarettes. If you need help quitting, ask your health care provider.  Do not drink alcoholic beverages. Alcohol is related to a number of birth defects.  Avoid toxic odors and chemicals.  You may continue to have sexual intercourse if it does not cause pain or other problems, such as vaginal bleeding. This information is not intended  to replace advice given to you by your health care provider. Make sure you discuss any questions you have with your health care provider. Document Released: 08/07/2008 Document Revised: 08/27/2017 Document Reviewed: 03/16/2016 Elsevier Patient Education  2020 [employer].

## 2019-04-05 LAB — CBC
Hematocrit: 41.6 % (ref 34.0–46.6)
Hemoglobin: 13.9 g/dL (ref 11.1–15.9)
MCH: 28.5 pg (ref 26.6–33.0)
MCHC: 33.4 g/dL (ref 31.5–35.7)
MCV: 85 fL (ref 79–97)
Platelets: 225 10*3/uL (ref 150–450)
RBC: 4.88 x10E6/uL (ref 3.77–5.28)
RDW: 12.2 % (ref 11.7–15.4)
WBC: 7.9 10*3/uL (ref 3.4–10.8)

## 2019-04-05 LAB — COMPREHENSIVE METABOLIC PANEL
ALT: 9 IU/L (ref 0–32)
AST: 16 IU/L (ref 0–40)
Albumin/Globulin Ratio: 2.2 (ref 1.2–2.2)
Albumin: 4.9 g/dL — ABNORMAL HIGH (ref 3.8–4.8)
Alkaline Phosphatase: 65 IU/L (ref 39–117)
BUN/Creatinine Ratio: 14 (ref 9–23)
BUN: 10 mg/dL (ref 6–20)
Bilirubin Total: 0.8 mg/dL (ref 0.0–1.2)
CO2: 23 mmol/L (ref 20–29)
Calcium: 9.4 mg/dL (ref 8.7–10.2)
Chloride: 98 mmol/L (ref 96–106)
Creatinine, Ser: 0.72 mg/dL (ref 0.57–1.00)
GFR calc Af Amer: 122 mL/min/{1.73_m2} (ref >59)
GFR calc non Af Amer: 106 mL/min/{1.73_m2} (ref >59)
Globulin, Total: 2.2 g/dL (ref 1.5–4.5)
Glucose: 90 mg/dL (ref 65–99)
Potassium: 4 mmol/L (ref 3.5–5.2)
Sodium: 139 mmol/L (ref 134–144)
Total Protein: 7.1 g/dL (ref 6.0–8.5)

## 2019-04-05 LAB — LIPID PANEL
Chol/HDL Ratio: 2.8 ratio (ref 0.0–4.4)
Cholesterol, Total: 172 mg/dL (ref 100–199)
HDL: 61 mg/dL (ref >39)
LDL Calculated: 99 mg/dL (ref 0–99)
Triglycerides: 62 mg/dL (ref 0–149)
VLDL Cholesterol Cal: 12 mg/dL (ref 5–40)

## 2019-04-05 LAB — RUBELLA SCREEN: Rubella Antibodies, IGG: 3.16 index (ref >0.99)

## 2019-04-21 ENCOUNTER — Other Ambulatory Visit: Payer: Self-pay

## 2019-04-21 ENCOUNTER — Encounter: Payer: Self-pay | Admitting: Obstetrics and Gynecology

## 2019-04-21 ENCOUNTER — Ambulatory Visit: Payer: BC Managed Care – PPO | Admitting: Obstetrics and Gynecology

## 2019-04-21 ENCOUNTER — Telehealth: Payer: Self-pay | Admitting: Obstetrics and Gynecology

## 2019-04-21 VITALS — BP 116/68 | HR 88 | Temp 98.0°F | Ht 65.25 in | Wt 150.0 lb

## 2019-04-21 DIAGNOSIS — N3090 Cystitis, unspecified without hematuria: Secondary | ICD-10-CM

## 2019-04-21 DIAGNOSIS — R350 Frequency of micturition: Secondary | ICD-10-CM

## 2019-04-21 DIAGNOSIS — R3915 Urgency of urination: Secondary | ICD-10-CM | POA: Diagnosis not present

## 2019-04-21 LAB — POCT URINALYSIS DIPSTICK
Bilirubin, UA: NEGATIVE
Glucose, UA: NEGATIVE
Ketones, UA: NEGATIVE
Nitrite, UA: NEGATIVE
Protein, UA: NEGATIVE
Urobilinogen, UA: 0.2 E.U./dL
pH, UA: 6 (ref 5.0–8.0)

## 2019-04-21 MED ORDER — SULFAMETHOXAZOLE-TRIMETHOPRIM 800-160 MG PO TABS: 1.0000 | ORAL_TABLET | Freq: Two times a day (BID) | ORAL | 0 refills | Status: DC

## 2019-04-21 MED ORDER — PHENAZOPYRIDINE HCL 200 MG PO TABS: 200.0000 mg | ORAL_TABLET | Freq: Three times a day (TID) | ORAL | 0 refills | Status: DC | PRN

## 2019-04-21 NOTE — Patient Instructions (Signed)

## 2019-04-21 NOTE — Progress Notes (Signed)
GYNECOLOGY  VISIT   HPI: 38 y.o.   Single Unavailable Not Hispanic or Latino  female   [id number] with Patient's last menstrual period was 04/21/2019.   here for urinary frequency and urgency started last night. Voiding small amounts, no dysuria. Some suprapubic discomfort when she feels she has to void. No fever, no back pain. She is on her cycle. Had intercourse the night prior to her symptoms starting.   GYNECOLOGIC HISTORY: Patient's last menstrual period was 04/21/2019. Contraception: none Menopausal hormone therapy: none        OB History    Gravida  1   Para  1   Term  1   Preterm  0   AB  0   Living  1     SAB  0   TAB  0   Ectopic  0   Multiple  0   Live Births  1              There are no active problems to display for this patient.   Past Medical History:  Diagnosis Date  . Abnormal Pap smear of cervix ~2002 & ~ 2006   COLPO biopsy with first abnormal  . Anxiety   . Depression   . History of IBS     Past Surgical History:  Procedure Laterality Date  . AUGMENTATION MAMMAPLASTY     implants  . BREAST SURGERY    . CESAREAN SECTION  01/1999  . CYST EXCISION  2006   seb. cyst upper back   . CYST EXCISION Left 2010   left buttock  . INTRAUTERINE DEVICE INSERTION  08/22/13   Mirena, first Mirena inserted 08/2008  . LAPAROSCOPIC CHOLECYSTECTOMY    . REFRACTIVE SURGERY      Current Outpatient Medications  Medication Sig Dispense Refill  . DULoxetine (CYMBALTA) 60 MG capsule Take 1 capsule by mouth daily.    Marland Kitchen. LINZESS 72 MCG capsule TAKE 1 CAPSULE BY MOUTH ONCE DAILY FOR 90 DAYS     No current facility-administered medications for this visit.      ALLERGIES: Percocet [oxycodone-acetaminophen]  Family History  Problem Relation Age of Onset  . Cancer Father 8058       Prostate   . Hypertension Maternal Grandmother   . Cancer Maternal Grandfather     Social History   Socioeconomic History  . Marital status: Single    Spouse name: Not  on file  . Number of children: Not on file  . Years of education: Not on file  . Highest education level: Not on file  Occupational History  . Not on file  Social Needs  . Financial resource strain: Not on file  . Food insecurity    Worry: Not on file    Inability: Not on file  . Transportation needs    Medical: Not on file    Non-medical: Not on file  Tobacco Use  . Smoking status: Never Smoker  . Smokeless tobacco: Never Used  Substance and Sexual Activity  . Alcohol use: Yes    Comment: socially  . Drug use: No  . Sexual activity: Yes    Partners: Male  Lifestyle  . Physical activity    Days per week: Not on file    Minutes per session: Not on file  . Stress: Not on file  Relationships  . Social Musicianconnections    Talks on phone: Not on file    Gets together: Not on file    Attends religious  service: Not on file    Active member of club or organization: Not on file    Attends meetings of clubs or organizations: Not on file    Relationship status: Not on file  . Intimate partner violence    Fear of current or ex partner: Not on file    Emotionally abused: Not on file    Physically abused: Not on file    Forced sexual activity: Not on file  Other Topics Concern  . Not on file  Social History Narrative  . Not on file    Review of Systems  Constitutional: Negative.   HENT: Negative.   Eyes: Negative.   Respiratory: Negative.   Cardiovascular: Negative.   Gastrointestinal: Negative.   Genitourinary: Positive for frequency and urgency.  Musculoskeletal: Negative.   Skin: Negative.   Neurological: Negative.   Endo/Heme/Allergies: Negative.   Psychiatric/Behavioral: Negative.   All other systems reviewed and are negative.   PHYSICAL EXAMINATION:    BP 116/68   Pulse 88   Temp 98 F (36.7 C) (Temporal)   Ht 5' 5.25" (1.657 m)   Wt 150 lb (68 kg)   LMP 04/21/2019   BMI 24.77 kg/m     General appearance: alert, cooperative and appears stated age Abdomen:  soft, non-tender; non distended, no masses,  no organomegaly CVA: not tender  Urine dip: 1+ leuk, moderate blood (on menses)  ASSESSMENT Cystitis    PLAN Treat with bactrim DS and pyridium Call with worsening symptoms   An After Visit Summary was printed and given to the patient.

## 2019-04-21 NOTE — Telephone Encounter (Signed)
Patient is calling regarding possible UTI. Patient stated that she is experiencing urgency, frequency, and "a little smell when I pee." Patient denies any fever or chills.

## 2019-04-21 NOTE — Telephone Encounter (Signed)
Spoke with patient. Patient reports urinary frequency and urgency, voiding small amounts and odorous urine. Symptoms started last night, kept her awake. IUD removed 2wks ago, menses started today. Denies flank pain, dysuria, fever/chills. [id number] prescreen negative, precautions reviewed. OV scheduled for today at 4:30pm with Dr. Talbert Nan.   Patient verbalizes understanding and is agreeable.   Encounter closed.

## 2019-04-22 LAB — URINALYSIS, MICROSCOPIC ONLY
Casts: NONE SEEN /lpf
Epithelial Cells (non renal): NONE SEEN /hpf (ref 0–10)
WBC, UA: >30 /hpf — AB (ref 0–5)

## 2019-04-23 LAB — URINE CULTURE

## 2019-05-20 ENCOUNTER — Telehealth: Payer: Self-pay | Admitting: Obstetrics and Gynecology

## 2019-05-20 NOTE — Telephone Encounter (Signed)
Spoke with patient. IUD removed on 04/04/19 in preparation for trying for pregnancy. Bleeding for one day on 04/21/19, is unsure if this was her cycle or from UTI. No menses since. UPT last week was negative. Denies N/V, breast tenderness or pain.   Patient asking if Dr. Talbert Nan is familiar with a "fertility cleanse"? She read about this online, is unclear what it is, but describes as "getting rid of medicine from IUD". Advised patient I will have to review with Dr. Talbert Nan when she returns to the office on 9/14 and return call. Advised patient it can take a few months after IUD is removed for cycles to regulate. Advised to continue to monitor, return call if new symptoms develop or no menses.   Dr. Talbert Nan -please review.

## 2019-05-20 NOTE — Telephone Encounter (Signed)
Patient is calling with questions regarding a fertility cleanse.

## 2019-05-23 NOTE — Telephone Encounter (Signed)
Please let the patient know that most women will have resumption of menses within 1-2 months following IUD removal. Women can become pregnant right after IUD removal. If she doesn't have a cycle by the end of the month she should be seen.  I would not recommend that she do a cleanse in regards to the IUD. The hormones are out of her system.

## 2019-05-23 NOTE — Telephone Encounter (Signed)
Call to patient. Advised patient of message as seen below from Dr. Talbert Nan and patient verbalized understanding. Aware to return call to office if no cycle by the end of September.   Will close encounter.

## 2019-06-14 ENCOUNTER — Telehealth: Payer: Self-pay | Admitting: Obstetrics and Gynecology

## 2019-06-14 NOTE — Telephone Encounter (Signed)
Patient cancelled appointment for missed menses. Her cycle came on yesterday.

## 2019-06-15 ENCOUNTER — Ambulatory Visit: Payer: BC Managed Care – PPO | Admitting: Obstetrics and Gynecology

## 2019-08-29 ENCOUNTER — Telehealth: Payer: Self-pay | Admitting: Obstetrics and Gynecology

## 2019-08-29 NOTE — Telephone Encounter (Signed)
Spoke with patient. IUD removed 03/2019. Reports LMP approximately 08/04/19. Since IUD has been removed, only bleeds for one day. Denies any other GYN symptoms. Request to discuss next steps in care for fertility. Recommended OV for further evaluation, patient agreeable to OV. OV scheduled for 09/12/19 at 10:30am with Dr. Talbert Nan. Advised patient our office will return call of any additional recommendations. Patient agreeable.   Last AEX 04/04/19  Routing to provider for final review. Patient is agreeable to disposition. Will close encounter.

## 2019-08-29 NOTE — Telephone Encounter (Signed)
Patient had her IUD removed in July 2020 and was told to call if she had not become pregnant.

## 2019-09-08 ENCOUNTER — Other Ambulatory Visit: Payer: Self-pay

## 2019-09-12 ENCOUNTER — Encounter: Payer: Self-pay | Admitting: Obstetrics and Gynecology

## 2019-09-12 ENCOUNTER — Other Ambulatory Visit: Payer: Self-pay

## 2019-09-12 ENCOUNTER — Ambulatory Visit: Payer: BC Managed Care – PPO | Admitting: Obstetrics and Gynecology

## 2019-09-12 VITALS — BP 104/60 | HR 76 | Temp 97.5°F | Ht 65.0 in | Wt 148.2 lb

## 2019-09-12 DIAGNOSIS — Z3169 Encounter for other general counseling and advice on procreation: Secondary | ICD-10-CM

## 2019-09-12 NOTE — Patient Instructions (Addendum)
Call with cycle, you need blood work on the 3rd day of your cycle. We will also set you up for a HSG You should buy ovulation predictor kits.    Hysterosalpingography  Hysterosalpingography is a procedure in which a woman's uterus and fallopian tubes are examined. During this procedure, contrast dye is injected into the uterus through the vagina and cervix. X-rays are then taken. The dye makes the uterus and fallopian tubes show up clearly on the X-rays. This procedure may be done:  To help determine whether there are tumors, scars (adhesions), or other abnormalities in the uterus.  To find out why a woman is unable to have children (infertile).  To make sure the fallopian tubes are completely blocked a few months after having certain tubal sterilization procedures. Tell a health care provider about:  Any allergies you have.  All medicines you are taking, including vitamins, herbs, eye drops, creams, and over-the-counter medicines.  Any problems you or family members have had with the use of anesthetic medicines.  Any blood disorders you have.  Any surgeries you have had.  Any medical conditions you have.  Whether you are pregnant or may be pregnant. What are the risks? Generally, this is a safe procedure. However, problems may occur, including:  Infection in the lining of the uterus (endometritis) or fallopian tubes (salpingitis).  Allergic reaction to medicines or dyes.  Risk of making a hole (perforation) in the uterus or fallopian tubes.  Damage to other structures or organs. What happens before the procedure?  Schedule the procedure after your menstrual period stops, but before your next ovulation. This is usually between day 5 and day 10 of your last period. Day 1 is the first day of your period.  Ask your health care provider about changing or stopping your regular medicines. This is especially important if you are taking diabetes medicines or blood  thinners.  Empty your bladder before the procedure begins.  Plan to have someone take you home from the hospital or clinic. What happens during the procedure?  You may be given one of the following: ? A medicine to help you relax (sedative). ? An over-the-counter pain medicine.  You will lie down on your back and place your feet into footrests (stirrups).  A device called a speculum will be inserted into your vagina. This allows your health care provider to see inside your vagina through to your cervix.  Your cervix will be washed with a germ-killing soap.  A medicine may be injected into your cervix to numb it (local anesthesia).  A thin, flexible tube will be passed through your cervix into your uterus.  Contrast dye will be passed through the tube and into the uterus. Contrast dye may cause some cramping.  Several X-rays will be taken as the contrast dye spreads through the uterus and into the fallopian tubes.  The tube will be removed. The contrast dye will flow out through your vagina naturally. The procedure may vary among health care providers and hospitals. What happens after the procedure?  Most of the contrast dye will flow out of your vagina naturally. You may want to wear a sanitary pad.  You may have mild cramping and vaginal bleeding. This should go away after a short time.  Do not drive for 24 hours if you were given a sedative.  It is up to you to get the results of your procedure. Ask your health care provider, or the department that is doing the procedure, when  your results will be ready. Summary  Hysterosalpingography is a procedure in which a woman's uterus and fallopian tubes are examined.  During this procedure, contrast dye is injected into the uterus through the vagina and cervix. X-rays are then taken. The dye helps the uterus and fallopian tubes show up clearly on the X-rays.  Schedule the procedure after your menstrual period stops, but before your  next ovulation. This is usually between day 5 and day 10 of your last period.  After the procedure, you may have mild cramping and vaginal bleeding. This should go away after a short time. This information is not intended to replace advice given to you by your health care provider. Make sure you discuss any questions you have with your health care provider. Document Revised: 08/07/2017 Document Reviewed: 09/17/2016 Elsevier Patient Education  2020 [employer].    Semen Analysis Test Why am I having this test? A semen analysis test is done to check certain aspects of the health of a man's reproductive organs (testes) and the hormone system that plays a role in semen production. Semen is a whitish secretion that is released from the penis during the final phase of orgasm (ejaculation). It is made up of liquids and nutrients from the prostate gland, seminal vesicles, and other glands. It also contains sperm cells from the testes. A single sperm cell contains one complete set of a man's genetic coding (chromosomes). You may have this test as a part of infertility testing, which is done to help find out reasons for the inability to have a child. The test may also be done to determine whether a previously performed vasectomy was successful. A vasectomy is a procedure done to make a man permanently infertile by tying the tube (the vas deferens) that collects the sperm from the testicle. A vasectomy blocks the sperm from going through the vas deferens and penis so that the sperm will not go into the vagina during sexual intercourse. What is being tested? If the test is done as part of infertility testing, the shape (morphology), size, and movement (motility) of sperm cells will be included in the analysis. This testing may also include assessing a sperm cell's ability to penetrate an egg (fertilize) as well as the formation of genetic material (DNA). If the test is done to check the success of a vasectomy,  the sample will be checked for the presence of sperm. What kind of sample is taken? A semen sample is required for this test. A semen sample will be collected by ejaculation into a sterile glass or plastic container provided by the lab. This can be done at home, in your health care provider's office, or in the lab. How do I collect samples at home?  If the sample will be collected at home, follow your health care provider's instructions about how to collect the sample. The sample should be delivered to the lab within 1 hour after collection. It should also be protected from extreme heat or cold. How do I prepare for this test? For infertility testing: Avoid sexual activity for 2-3 days before the semen sample collection. However, do not avoid ejaculation for a prolonged period because this can alter the motility of sperm cells. For vasectomy success testing: Make sure that you ejaculate one or two times before the day of semen sample collection. This will clear the vas deferens of any sperm that were present before the vasectomy was performed. Tell a health care provider about:  All medicines you  are taking, including vitamins, herbs, eye drops, creams, and over-the-counter medicines. How are the results reported? Your test results will be reported as values. Your health care provider will compare your results to normal ranges that were established after testing a large group of people (reference values). Reference values may vary among labs and hospitals. For this test, common reference values are:  Volume: 2-5 mL.  Liquefaction time: 20-30 minutes after collection.  Appearance: normal (whitish in color).  Motile/mL: greater than or equal to 10 million.  Sperm/mL: greater than or equal to 20 million.  Viscosity: greater than or equal to 3.  Agglutination: greater than or equal to 3.  Supravital: greater than or equal to 75% live.  Fructose: positive.  pH: 7.12-8.  Sperm count  (density): greater than or equal to 20 million/mL.  Sperm motility: greater than or equal to 50% at 1 hour.  Sperm morphology: greater than 30% Reuben Likes criteria greater than 14%) normally shaped. What do the results mean? Low sperm count, abnormal motility, or abnormal morphology of sperm cells may all cause problems with female fertility. Abnormal results can also be caused by:  Certain infectious diseases, such as inflammation of a testis (orchitis) from mumps.  Receiving certain kinds of toxic drugs, such as chemotherapy.  Having testicles that did not develop normally in childhood. When the semen analysis test is done to check the success of a vasectomy, the presence of sperm may mean that the surgery was not successful. Your health care provider may suggest a repeat of this test. Talk with your health care provider about what your results mean. Questions to ask your health care provider Ask your health care provider, or the department that is doing the test:  When will my results be ready?  How will I get my results?  What are my treatment options?  What other tests do I need?  What are my next steps? Summary  A semen analysis test is done to check certain aspects of the health of a man's reproductive organs (testes) and the hormone system that plays a role in semen production.  You may have this test as a part of infertility testing, which is done to help find out reasons for the inability to have a child. The test may also be done to determine whether a previously performed vasectomy was successful.  A semen sample will be collected by ejaculation into a sterile glass or plastic container. Follow instructions about how to collect the sample, and make sure you deliver the sample to the lab within 1 hour of obtaining it.  Talk with your health care provider about what your results mean. This information is not intended to replace advice given to you by your health care  provider. Make sure you discuss any questions you have with your health care provider. Document Revised: 05/21/2017 Document Reviewed: 05/21/2017 Elsevier Patient Education  2020 [employer].    Female Infertility  Female infertility refers to a woman's inability to get pregnant (conceive) after a year of having sex regularly (or after 6 months in women over age 88) without using birth control. Infertility can also mean that a woman is not able to carry a pregnancy to full term. Both women and men can have fertility problems. What are the causes? This condition may be caused by:  Problems with reproductive organs. Infertility can result if a woman: ? Has an abnormally short cervix or a cervix that does not remain closed during a pregnancy. ? Has  a blockage or scarring in the fallopian tubes. ? Has an abnormally shaped uterus. ? Has uterine fibroids. This is a benign mass of tissue or muscle (tumor) that can develop in the uterus. ? Is not ovulating in a regular way.  Certain medical conditions. These may include: ? Polycystic ovary syndrome (PCOS). This is a hormonal disorder that can cause small cysts to grow on the ovaries. This is the most common cause of infertility in women. ? Endometriosis. This is a condition in which the tissue that lines the uterus (endometrium) grows outside of its normal location. ? Cancer and cancer treatments, such as chemotherapy or radiation. ? Premature ovarian failure. This is when ovaries stop producing eggs and hormones before age 17. ? Sexually transmitted diseases, such as chlamydia or gonorrhea. ? Autoimmune disorders. These are disorders in which the body's defense system (immune system) attacks normal, healthy cells. Infertility can be linked to more than one cause. For some women, the cause of infertility is not known (unexplained infertility). What increases the risk?  Age. A woman's fertility declines with age, especially after her  mid-30s.  Being underweight or overweight.  Drinking too much alcohol.  Using drugs such as anabolic steroids, cocaine, and marijuana.  Exercising excessively.  Being exposed to environmental toxins, such as radiation, pesticides, and certain chemicals. What are the signs or symptoms? The main sign of infertility in women is the inability to get pregnant or carry a pregnancy to full term. How is this diagnosed? This condition may be diagnosed by:  Checking whether you are ovulating each month. The tests may include: ? Blood tests to check hormone levels. ? An ultrasound of the ovaries. ? Taking a small tissue that lines the uterus and checking it under a microscope (endometrial biopsy).  Doing additional tests. This is done if ovulation is normal. Tests may include: ? Hysterosalpingography. This X-ray test can show the shape of the uterus and whether the fallopian tubes are open. ? Laparoscopy. This test uses a lighted tube (laparoscope) to look for problems in the fallopian tubes and other organs. ? Transvaginal ultrasound. This imaging test is used to check for abnormalities in the uterus and ovaries. ? Hysteroscopy. This test uses a lighted tube to check for problems in the cervix and the uterus. To be diagnosed with infertility, both partners will have a physical exam. Both partners will also have an extensive medical and sexual history taken. Additional tests may be done. How is this treated? Treatment depends on the cause of infertility. Most cases of infertility in women are treated with medicine or surgery.  Women may take medicine to: ? Correct ovulation problems. ? Treat other health conditions.  Surgery may be done to: ? Repair damage to the ovaries, fallopian tubes, cervix, or uterus. ? Remove growths from the uterus. ? Remove scar tissue from the uterus, pelvis, or other organs. Assisted reproductive technology (ART) Assisted reproductive technology (ART) refers to  all treatments and procedures that combine eggs and sperm outside the body to try to help a couple conceive. ART is often combined with fertility drugs to stimulate ovulation. Sometimes ART is done using eggs retrieved from another woman's body (donor eggs) or from previously frozen fertilized eggs (embryos). There are different types of ART. These include:  Intrauterine insemination (IUI). A long, thin tube is used to place sperm directly into a woman's uterus. This procedure: ? Is effective for infertility caused by sperm problems, including low sperm count and low motility. ? Can  be used in combination with fertility drugs.  In vitro fertilization (IVF). This is done when a woman's fallopian tubes are blocked or when a man has low sperm count. In this procedure: ? Fertility drugs are used to stimulate the ovaries to produce multiple eggs. ? Once mature, these eggs are removed from the body and combined with the sperm to be fertilized. ? The fertilized eggs are then placed into the woman's uterus. Follow these instructions at home:  Take over-the-counter and prescription medicines only as told by your health care provider.  Do not use any products that contain nicotine or tobacco, such as cigarettes and e-cigarettes. If you need help quitting, ask your health care provider.  If you drink alcohol, limit how much you have to 1 drink a day.  Make dietary changes to lose weight or maintain a healthy weight. Work with your health care provider and a dietitian to set a weight-loss goal that is healthy and reasonable for you.  Seek support from a counselor or support group to talk about your concerns related to infertility. Couples counseling may be helpful for you and your partner.  Practice stress reduction techniques that work well for you, such as regular physical activity, meditation, or deep breathing.  Keep all follow-up visits as told by your health care provider. This is  important. Contact a health care provider if you:  Feel that stress is interfering with your life and relationships.  Have side effects from treatments for infertility. Summary  Female infertility refers to a woman's inability to get pregnant (conceive) after a year of having sex regularly (or after 6 months in women over age 40) without using birth control.  To be diagnosed with infertility, both partners will have a physical exam. Both partners will also have an extensive medical and sexual history taken.  Seek support from a counselor or support group to talk about your concerns related to infertility. Couples counseling may be helpful for you and your partner. This information is not intended to replace advice given to you by your health care provider. Make sure you discuss any questions you have with your health care provider. Document Revised: 12/16/2018 Document Reviewed: 07/27/2017 Elsevier Patient Education  2020 [employer]

## 2019-09-12 NOTE — Progress Notes (Signed)
GYNECOLOGY  VISIT   HPI: 39 y.o.   Single Unavailable Not Hispanic or Latino  female   G1P1001 with Patient's last menstrual period was 08/30/2019.   here for   Fertility Consult. She had her IUD removed at the end of July, 2020 to attempt pregnancy. Cycles have been every 28 days x 1 day, very light only needs a panty liner. No moliminal symptoms. She is having sex ~2 x a week. She has a 5 year old daughter, she has been on birth control since her daughter was born until last summer.  No h/o STD's. Partner, Ronnald Ramp is 41 (DOB 12/06/77), no children. No know infertility. No medical problems, no smoking or cannabis.   GYNECOLOGIC HISTORY: Patient's last menstrual period was 08/30/2019. Contraception:none  Menopausal hormone therapy: none         OB History    Gravida  1   Para  1   Term  1   Preterm  0   AB  0   Living  1     SAB  0   TAB  0   Ectopic  0   Multiple  0   Live Births  1              There are no problems to display for this patient.   Past Medical History:  Diagnosis Date  . Abnormal Pap smear of cervix ~2002 & ~ 2006   COLPO biopsy with first abnormal  . Anxiety   . Depression   . History of IBS     Past Surgical History:  Procedure Laterality Date  . AUGMENTATION MAMMAPLASTY     implants  . BREAST SURGERY    . CESAREAN SECTION  01/1999  . CYST EXCISION  2006   seb. cyst upper back   . CYST EXCISION Left 2010   left buttock  . INTRAUTERINE DEVICE INSERTION  08/22/13   Mirena, first Mirena inserted 08/2008  . LAPAROSCOPIC CHOLECYSTECTOMY    . REFRACTIVE SURGERY      Current Outpatient Medications  Medication Sig Dispense Refill  . DULoxetine (CYMBALTA) 60 MG capsule Take 60 mg by mouth daily.    Marland Kitchen LINZESS 72 MCG capsule TAKE 1 CAPSULE BY MOUTH ONCE DAILY FOR 90 DAYS     No current facility-administered medications for this visit.     ALLERGIES: Percocet [oxycodone-acetaminophen]  Family History  Problem Relation Age  of Onset  . Cancer Father 60       Prostate   . Hypertension Maternal Grandmother   . Cancer Maternal Grandfather     Social History   Socioeconomic History  . Marital status: Single    Spouse name: Not on file  . Number of children: Not on file  . Years of education: Not on file  . Highest education level: Not on file  Occupational History  . Not on file  Tobacco Use  . Smoking status: Never Smoker  . Smokeless tobacco: Never Used  Substance and Sexual Activity  . Alcohol use: Yes    Comment: socially  . Drug use: No  . Sexual activity: Yes    Partners: Male  Other Topics Concern  . Not on file  Social History Narrative  . Not on file   Social Determinants of Health   Financial Resource Strain:   . Difficulty of Paying Living Expenses: Not on file  Food Insecurity:   . Worried About Programme researcher, broadcasting/film/video in the Last Year: Not on  file  . [place] in the Last Year: Not on file  Transportation Needs:   . Lack of Transportation (Medical): Not on file  . Lack of Transportation (Non-Medical): Not on file  Physical Activity:   . Days of Exercise per Week: Not on file  . Minutes of Exercise per Session: Not on file  Stress:   . Feeling of Stress : Not on file  Social Connections:   . Frequency of Communication with Friends and Family: Not on file  . Frequency of Social Gatherings with Friends and Family: Not on file  . Attends Religious Services: Not on file  . Active Member of Clubs or Organizations: Not on file  . Attends Archivist Meetings: Not on file  . Marital Status: Not on file  Intimate Partner Violence:   . Fear of Current or Ex-Partner: Not on file  . Emotionally Abused: Not on file  . Physically Abused: Not on file  . Sexually Abused: Not on file    Review of Systems  Constitutional: Negative.   HENT: Negative.   Eyes: Negative.   Respiratory: Negative.   Cardiovascular: Negative.   Gastrointestinal: Negative.   Genitourinary:  Negative.   Musculoskeletal: Negative.   Skin: Negative.   Neurological: Negative.   Endo/Heme/Allergies: Negative.   Psychiatric/Behavioral:       Anxiety     PHYSICAL EXAMINATION:    BP 104/60   Pulse 76   Temp (!) 97.5 F (36.4 C)   Ht 5\' 5"  (1.651 m)   Wt 148 lb 3.2 oz (67.2 kg)   LMP 08/30/2019   SpO2 96%   BMI 24.66 kg/m     General appearance: alert, cooperative and appears stated age   ASSESSMENT Secondary infertility AMA    PLAN Restart prenatal vitamins Recommenced she start doing ovulation predictor kits. Semen analysis Cycle day #3 FSH, estradiol, AM[facility] Recommend HSG Will place referral to Dr Kerin Perna   An After Visit Summary was printed and given to the patient.  ~20 minutes face to face time of which over 50% was spent in counseling.

## 2019-09-27 ENCOUNTER — Telehealth: Payer: Self-pay | Admitting: Obstetrics and Gynecology

## 2019-09-27 DIAGNOSIS — N979 Female infertility, unspecified: Secondary | ICD-10-CM

## 2019-09-27 NOTE — Telephone Encounter (Signed)
Spoke with patient.  LMP 09/26/19 Patient request to proceed with HSG and Day 3 labs discussed at 09/12/19 OV.   Lab appt scheduled for 1/20 at 3:25pm Future lab orders placed.   Advised patient HSG is scheduled at [facility] days 7-10 of cycle. RN will call to schedule and return call with appt information.   HSG order placed.   Patient verbalizes understanding and is agreeable.

## 2019-09-27 NOTE — Telephone Encounter (Signed)
Spoke with Alcario Drought at [facility] Was advised there is no one  available today to schedule HSG, will return call on 1/20 to schedule. Alcario Drought will forward to her supervisor for further assistance. Advised patient started menses on 1/18, order placed in Epic.

## 2019-09-27 NOTE — Telephone Encounter (Signed)
Patient was told to call when she sated her cycle for labs and an HSG with Dr.Jertson.

## 2019-09-27 NOTE — Telephone Encounter (Signed)
Call returned to patient to provide update. Advised patient our office will f/u once HSG is scheduled. Patient verbalizes understanding and is agreeable.    Routing to Dr. Shirley Friar.   Cc: Harland Dingwall, Soundra Pilon

## 2019-09-28 ENCOUNTER — Telehealth: Payer: Self-pay | Admitting: Obstetrics and Gynecology

## 2019-09-28 ENCOUNTER — Other Ambulatory Visit: Payer: Self-pay

## 2019-09-28 ENCOUNTER — Other Ambulatory Visit (INDEPENDENT_AMBULATORY_CARE_PROVIDER_SITE_OTHER): Payer: BC Managed Care – PPO

## 2019-09-28 DIAGNOSIS — Z3169 Encounter for other general counseling and advice on procreation: Secondary | ICD-10-CM

## 2019-09-28 DIAGNOSIS — N979 Female infertility, unspecified: Secondary | ICD-10-CM

## 2019-09-28 NOTE — Telephone Encounter (Signed)
Patient says she has not heard from Dr Lyndal Rainbow office about an appointment.

## 2019-09-28 NOTE — Telephone Encounter (Signed)
Please let me know when the HSG is scheduled. She should call the office a few hours after her HSG. If her tubes are dilated she would need antibiotics.

## 2019-09-28 NOTE — Telephone Encounter (Addendum)
Pt at office. Pt returned Semen Analysis consent. Form to be scanned . Picked up semen analysis kit. Referral placed to Dr Kerin Perna. Pt is aware will be contacted directly  by Dr Kerin Perna office for appt.   Routing to Dr Talbert Nan for review and will close encounter Cc: Rosa to proceed with referral process.

## 2019-09-28 NOTE — Telephone Encounter (Signed)
Call placed to [facility], spoke Scott. Was advised Pieter Partridge is on the phone, she will contact patient directly to schedule.

## 2019-09-29 NOTE — Telephone Encounter (Signed)
Per review of Epic, patient is scheduled for HSG on 1/25 at 2pm.   Call to patient, advised as seen below per Dr. Oscar La. Advised patient our office phones do go off at 4pm, recommended patient call prior to 4pm to provide update. Patient verbalizes understanding and is agreeable.   Routing to provider for final review. Patient is agreeable to disposition. Will close encounter.  Cc: Harland Dingwall, Soundra Pilon

## 2019-10-02 LAB — FOLLICLE STIMULATING HORMONE: FSH: 8.6 m[IU]/mL

## 2019-10-02 LAB — ESTRADIOL: Estradiol: 30.1 pg/mL

## 2019-10-02 LAB — ANTI MULLERIAN HORMONE: ANTI-MULLERIAN HORMONE (AMH): 2.79 ng/mL

## 2019-10-03 ENCOUNTER — Telehealth: Payer: Self-pay | Admitting: Obstetrics and Gynecology

## 2019-10-03 ENCOUNTER — Ambulatory Visit
Admission: RE | Admit: 2019-10-03 | Discharge: 2019-10-03 | Disposition: A | Discharge status: Home / Self Care | Payer: BC Managed Care – PPO | Source: Ambulatory Visit | Attending: Obstetrics and Gynecology | Admitting: Obstetrics and Gynecology

## 2019-10-03 DIAGNOSIS — N979 Female infertility, unspecified: Secondary | ICD-10-CM

## 2019-10-03 NOTE — Telephone Encounter (Signed)
Spoke with patient. HSG completed today at [facility] imaging. Patient states " both tubes were open and good". Advised patient no Abx needed, will review with Dr. Oscar La and return call if any additional recommendations. Patient agreeable.   Routing to Dr. Oscar La to review.

## 2019-10-03 NOTE — Telephone Encounter (Signed)
Great news! Her report is in the chart. No antibiotics needed. Referral has already been placed to Dr April Manson.

## 2019-10-03 NOTE — Telephone Encounter (Signed)
Patient is calling regarding HSG. Patient stated "They were good and both tubes are open."

## 2019-10-03 NOTE — Telephone Encounter (Signed)
Spoke with patient, advised per Dr. Jertson. Patient verbalizes understanding and is agreeable.   Encounter closed.  

## 2019-10-21 ENCOUNTER — Ambulatory Visit: Payer: Self-pay | Attending: Internal Medicine

## 2019-10-21 ENCOUNTER — Ambulatory Visit: Payer: BC Managed Care – PPO | Attending: Internal Medicine

## 2019-10-21 DIAGNOSIS — Z20822 Contact with and (suspected) exposure to covid-19: Secondary | ICD-10-CM

## 2019-10-22 LAB — NOVEL CORONAVIRUS, NAA: SARS-CoV-2, NAA: NOT DETECTED

## 2019-11-15 ENCOUNTER — Telehealth: Payer: Self-pay | Admitting: Obstetrics and Gynecology

## 2019-11-15 NOTE — Telephone Encounter (Signed)
Patient is calling regarding possible UTI. Patient declined appointment today at 430.

## 2019-11-15 NOTE — Telephone Encounter (Signed)
Spoke to pt. Pt states having urgency since yesterday. Pt thinks has UTI. Denies burning and pain. Pt scheduled with Dr Oscar La at 8:00 am and is aware a work in appt. Pt agreeable. CPS Neg. No other appts available this week. Pt declines to see another provider.  Routing to Dr Oscar La for review and will close encounter.

## 2019-11-16 ENCOUNTER — Encounter: Payer: Self-pay | Admitting: Obstetrics and Gynecology

## 2019-11-16 ENCOUNTER — Ambulatory Visit: Payer: BC Managed Care – PPO | Admitting: Obstetrics and Gynecology

## 2019-11-16 ENCOUNTER — Other Ambulatory Visit: Payer: Self-pay

## 2019-11-16 VITALS — BP 120/60 | HR 96 | Temp 97.9°F | Wt 146.0 lb

## 2019-11-16 DIAGNOSIS — N3001 Acute cystitis with hematuria: Secondary | ICD-10-CM | POA: Diagnosis not present

## 2019-11-16 LAB — POCT URINALYSIS DIPSTICK
Bilirubin, UA: NEGATIVE
Glucose, UA: NEGATIVE
Ketones, UA: NEGATIVE
Nitrite, UA: NEGATIVE
Protein, UA: NEGATIVE
Urobilinogen, UA: NEGATIVE E.U./dL — AB
pH, UA: 7 (ref 5.0–8.0)

## 2019-11-16 MED ORDER — PHENAZOPYRIDINE HCL 200 MG PO TABS: 200.0000 mg | ORAL_TABLET | Freq: Three times a day (TID) | ORAL | 0 refills | Status: DC | PRN

## 2019-11-16 MED ORDER — SULFAMETHOXAZOLE-TRIMETHOPRIM 800-160 MG PO TABS: 1.0000 | ORAL_TABLET | Freq: Two times a day (BID) | ORAL | 0 refills | Status: DC

## 2019-11-16 NOTE — Progress Notes (Signed)
GYNECOLOGY  VISIT   HPI: 39 y.o.   Single   Unavailable American [place] or [place] Native Not Hispanic or Latino  female   [phone] with Patient's last menstrual period was 10/30/2019 (exact date).   here for urgency of urination. States that her symptoms started on Monday evening.  There is discomfort when she voids, not burning, but discomfort. Some frequency to void. She has had some blood in her urine (not on her cycle). No fevers, no flank pain.   GYNECOLOGIC HISTORY: Patient's last menstrual period was 10/30/2019 (exact date). Contraception:none Menopausal hormone therapy: none        OB History    Gravida  1   Para  1   Term  1   Preterm  0   AB  0   Living  1     SAB  0   TAB  0   Ectopic  0   Multiple  0   Live Births  1              There are no problems to display for this patient.   Past Medical History:  Diagnosis Date  . Abnormal Pap smear of cervix ~2002 & ~ 2006   COLPO biopsy with first abnormal  . Anxiety   . Depression   . History of IBS     Past Surgical History:  Procedure Laterality Date  . AUGMENTATION MAMMAPLASTY     implants  . BREAST SURGERY    . CESAREAN SECTION  01/1999  . CYST EXCISION  2006   seb. cyst upper back   . CYST EXCISION Left 2010   left buttock  . INTRAUTERINE DEVICE INSERTION  08/22/13   Mirena, first Mirena inserted 08/2008  . LAPAROSCOPIC CHOLECYSTECTOMY    . REFRACTIVE SURGERY      Current Outpatient Medications  Medication Sig Dispense Refill  . DULoxetine (CYMBALTA) 60 MG capsule Take 60 mg by mouth daily.    Marland Kitchen LINZESS 72 MCG capsule TAKE 1 CAPSULE BY MOUTH ONCE DAILY FOR 90 DAYS     No current facility-administered medications for this visit.     ALLERGIES: Percocet [oxycodone-acetaminophen]  Family History  Problem Relation Age of Onset  . Cancer Father 86       Prostate   . Hypertension Maternal Grandmother   . Cancer Maternal Grandfather     Social History   Socioeconomic  History  . Marital status: Single    Spouse name: Not on file  . Number of children: Not on file  . Years of education: Not on file  . Highest education level: Not on file  Occupational History  . Not on file  Tobacco Use  . Smoking status: Never Smoker  . Smokeless tobacco: Never Used  Substance and Sexual Activity  . Alcohol use: Yes    Comment: socially  . Drug use: No  . Sexual activity: Yes    Partners: Male  Other Topics Concern  . Not on file  Social History Narrative  . Not on file   Social Determinants of Health   Financial Resource Strain:   . Difficulty of Paying Living Expenses: Not on file  Food Insecurity:   . Worried About Programme researcher, broadcasting/film/video in the Last Year: Not on file  . Ran Out of Food in the Last Year: Not on file  Transportation Needs:   . Lack of Transportation (Medical): Not on file  . Lack of Transportation (Non-Medical): Not on file  Physical Activity:   . Days of Exercise per Week: Not on file  . Minutes of Exercise per Session: Not on file  Stress:   . Feeling of Stress : Not on file  Social Connections:   . Frequency of Communication with Friends and Family: Not on file  . Frequency of Social Gatherings with Friends and Family: Not on file  . Attends Religious Services: Not on file  . Active Member of Clubs or Organizations: Not on file  . Attends Archivist Meetings: Not on file  . Marital Status: Not on file  Intimate Partner Violence:   . Fear of Current or Ex-Partner: Not on file  . Emotionally Abused: Not on file  . Physically Abused: Not on file  . Sexually Abused: Not on file    Review of Systems  Genitourinary: Positive for frequency, hematuria and urgency.  All other systems reviewed and are negative.   PHYSICAL EXAMINATION:    BP 120/60   Pulse 96   Temp 97.9 F (36.6 C)   Wt 146 lb (66.2 kg)   LMP 10/30/2019 (Exact Date)   SpO2 97%   BMI 24.30 kg/m     General appearance: alert, cooperative and  appears stated age Abdomen: soft, non-tender; non distended, no masses,  no organomegaly CVA: not tender    Urine dip: Moderate blood, 3+ leuk  ASSESSMENT Cystitis with hematuria    PLAN Send urine for ua, c&s Treat with Bactrim and Pyridium   An After Visit Summary was printed and given to the patient.

## 2019-11-16 NOTE — Patient Instructions (Signed)

## 2019-11-17 LAB — URINALYSIS, MICROSCOPIC ONLY
Casts: NONE SEEN /lpf
WBC, UA: >30 /hpf — AB (ref 0–5)

## 2019-11-18 LAB — URINE CULTURE

## 2019-11-25 ENCOUNTER — Encounter: Payer: Self-pay | Admitting: Certified Nurse Midwife

## 2020-01-11 ENCOUNTER — Other Ambulatory Visit: Payer: Self-pay

## 2020-01-11 ENCOUNTER — Ambulatory Visit: Payer: BC Managed Care – PPO | Admitting: Obstetrics and Gynecology

## 2020-01-11 ENCOUNTER — Encounter: Payer: Self-pay | Admitting: Obstetrics and Gynecology

## 2020-01-11 VITALS — BP 130/70 | HR 109 | Temp 98.1°F | Ht 65.0 in | Wt 149.0 lb

## 2020-01-11 DIAGNOSIS — N3090 Cystitis, unspecified without hematuria: Secondary | ICD-10-CM

## 2020-01-11 DIAGNOSIS — R3915 Urgency of urination: Secondary | ICD-10-CM | POA: Diagnosis not present

## 2020-01-11 LAB — POCT URINALYSIS DIPSTICK
Bilirubin, UA: NEGATIVE
Glucose, UA: NEGATIVE
Ketones, UA: NEGATIVE
Nitrite, UA: NEGATIVE
Protein, UA: NEGATIVE
Urobilinogen, UA: NEGATIVE E.U./dL — AB
pH, UA: 7 (ref 5.0–8.0)

## 2020-01-11 MED ORDER — SULFAMETHOXAZOLE-TRIMETHOPRIM 800-160 MG PO TABS: 1.0000 | ORAL_TABLET | Freq: Two times a day (BID) | ORAL | 0 refills | Status: DC

## 2020-01-11 MED ORDER — PHENAZOPYRIDINE HCL 200 MG PO TABS: 200.0000 mg | ORAL_TABLET | Freq: Three times a day (TID) | ORAL | 0 refills | Status: DC | PRN

## 2020-01-11 NOTE — Patient Instructions (Signed)

## 2020-01-11 NOTE — Progress Notes (Signed)
GYNECOLOGY  VISIT   HPI: 39 y.o.   Single   Unavailable American [place] or [place] Native Not Hispanic or Latino  female   [phone] with No LMP recorded.   here for urine odor. She is having urinary urgency. She started having symptoms on Sunday. Voiding normal amounts. Some urethral discomfort. No vaginitis symptoms. Sexually active, same partner x 3 years. No increase in sexual activity, but she has been drinking more soda and wine. No flank or abdominal pain.   Urine is cloudy, no blood.  She did have a UTI in 3/21.    LMP 12/26/19  GYNECOLOGIC HISTORY: No LMP recorded. Contraception: none  Menopausal hormone therapy: none         OB History    Gravida  1   Para  1   Term  1   Preterm  0   AB  0   Living  1     SAB  0   TAB  0   Ectopic  0   Multiple  0   Live Births  1              There are no problems to display for this patient.   Past Medical History:  Diagnosis Date  . Abnormal Pap smear of cervix ~2002 & ~ 2006   COLPO biopsy with first abnormal  . Anxiety   . Depression   . History of IBS     Past Surgical History:  Procedure Laterality Date  . AUGMENTATION MAMMAPLASTY     implants  . BREAST SURGERY    . CESAREAN SECTION  01/1999  . CYST EXCISION  2006   seb. cyst upper back   . CYST EXCISION Left 2010   left buttock  . INTRAUTERINE DEVICE INSERTION  08/22/13   Mirena, first Mirena inserted 08/2008  . LAPAROSCOPIC CHOLECYSTECTOMY    . REFRACTIVE SURGERY      Current Outpatient Medications  Medication Sig Dispense Refill  . DULoxetine (CYMBALTA) 60 MG capsule Take 60 mg by mouth daily.    Marland Kitchen LINZESS 72 MCG capsule TAKE 1 CAPSULE BY MOUTH ONCE DAILY FOR 90 DAYS    . phenazopyridine (PYRIDIUM) 200 MG tablet Take 1 tablet (200 mg total) by mouth 3 (three) times daily as needed. 6 tablet 0  . sulfamethoxazole-trimethoprim (BACTRIM DS) 800-160 MG tablet Take 1 tablet by mouth 2 (two) times daily. One PO BID x 3 days 6 tablet 0   No  current facility-administered medications for this visit.     ALLERGIES: Percocet [oxycodone-acetaminophen]  Family History  Problem Relation Age of Onset  . Cancer Father 54       Prostate   . Hypertension Maternal Grandmother   . Cancer Maternal Grandfather     Social History   Socioeconomic History  . Marital status: Single    Spouse name: Not on file  . Number of children: Not on file  . Years of education: Not on file  . Highest education level: Not on file  Occupational History  . Not on file  Tobacco Use  . Smoking status: Never Smoker  . Smokeless tobacco: Never Used  Substance and Sexual Activity  . Alcohol use: Yes    Comment: socially  . Drug use: No  . Sexual activity: Yes    Partners: Male  Other Topics Concern  . Not on file  Social History Narrative  . Not on file   Social Determinants of Health   Financial Resource  Strain:   . Difficulty of Paying Living Expenses:   Food Insecurity:   . Worried About Charity fundraiser in the Last Year:   . Arboriculturist in the Last Year:   Transportation Needs:   . Film/video editor (Medical):   Marland Kitchen Lack of Transportation (Non-Medical):   Physical Activity:   . Days of Exercise per Week:   . Minutes of Exercise per Session:   Stress:   . Feeling of Stress :   Social Connections:   . Frequency of Communication with Friends and Family:   . Frequency of Social Gatherings with Friends and Family:   . Attends Religious Services:   . Active Member of Clubs or Organizations:   . Attends Archivist Meetings:   Marland Kitchen Marital Status:   Intimate Partner Violence:   . Fear of Current or Ex-Partner:   . Emotionally Abused:   Marland Kitchen Physically Abused:   . Sexually Abused:     Review of Systems  Genitourinary: Positive for hematuria and urgency.  All other systems reviewed and are negative.   PHYSICAL EXAMINATION:    BP 130/70   Pulse (!) 109   Temp 98.1 F (36.7 C)   Ht 5\' 5"  (1.651 m)   Wt 149  lb (67.6 kg)   SpO2 100%   BMI 24.79 kg/m     General appearance: alert, cooperative and appears stated age Abdomen: soft, non-tender; non distended, no masses,  no organomegaly CVA: not tender  Urine dip: 2+ RBC, 3+ WBC  ASSESSMENT Cystitis    PLAN Send urine for ua, c&s Treat with Bactrim DS and Pyridium   An After Visit Summary was printed and given to the patient.

## 2020-01-12 LAB — URINALYSIS, MICROSCOPIC ONLY
Casts: NONE SEEN /lpf
RBC, Urine: >30 /hpf — AB (ref 0–2)
WBC, UA: >30 /hpf — AB (ref 0–5)

## 2020-01-13 LAB — URINE CULTURE

## 2020-03-19 ENCOUNTER — Encounter: Payer: Self-pay | Admitting: Obstetrics and Gynecology

## 2020-03-27 ENCOUNTER — Encounter: Payer: Self-pay | Admitting: Obstetrics and Gynecology

## 2020-03-30 ENCOUNTER — Telehealth: Payer: Self-pay | Admitting: Obstetrics and Gynecology

## 2020-03-30 NOTE — Telephone Encounter (Signed)
Patient would like to come in for pregnancy confirmation. She did  "see a fertility doctor and was told there was not heartbeat". She is not sure what to do now?

## 2020-03-30 NOTE — Telephone Encounter (Signed)
Spoke with patient. Patient was seen by fertility specialist for new consult on 03/19/20, reports PUS was completed and it was determined that she was pregnant. Returned for PUS on 7/20, reports no heartbeat, is returning for repeat PUS on 7/26. Patient states if no heartbeat, plan is to discuss termination options.   LMP 6/8 -6/10.  Patient reports passing a "huge" blood clot x1 on 03/02/20.  Did not have intercourse after menses until 6/28.  No blood pregnancy test completed. Home UPT positive on 03/19/20.  Denies vaginal bleeding or pain.   Patient asking if f/u with Dr. Oscar La would be appropriate after her PUS on 7/26?  Advised I will review with covering provider and return call with recommendations. Patient agreeable.

## 2020-03-30 NOTE — Telephone Encounter (Signed)
Ok for OV with Dr. Oscar La to help pt in decision making process.  We will need records so she needs get them or have them faxed to [place].

## 2020-03-30 NOTE — Telephone Encounter (Signed)
Spoke with patient, advised as seen below per Dr. Hyacinth Meeker.  Patient will be out of town 7/27 -7/30.  OV scheduled for 04/10/20 at 4:30pm. Patient declined earlier appt.  Fax number provided to patient for records.  Patient verbalizes understanding and is agreeable.   Routing to provider for final review. Patient is agreeable to disposition. Will close encounter.  Cc: Dr. Oscar La

## 2020-04-04 ENCOUNTER — Ambulatory Visit: Payer: BC Managed Care – PPO | Admitting: Obstetrics and Gynecology

## 2020-04-05 ENCOUNTER — Telehealth: Payer: Self-pay | Admitting: *Deleted

## 2020-04-05 DIAGNOSIS — Z3201 Encounter for pregnancy test, result positive: Secondary | ICD-10-CM

## 2020-04-05 DIAGNOSIS — N912 Amenorrhea, unspecified: Secondary | ICD-10-CM

## 2020-04-05 NOTE — Telephone Encounter (Signed)
Call to patient for update (see previous telephone encounter dated 03/30/20.  No records received to date from Dr. April Manson.  Patient is scheduled for OV w/ Dr. Oscar La on 8/3 at 4:30pm.  Spoke with patient, patient states she went for her appt on 04/02/20, waited for over an hour, left without being seen. No appt rescheduled. Patient states she requested records to be faxed to [facility] on 04/02/20. Patient will f/u with their office today for records request. Will await records.

## 2020-04-05 NOTE — Telephone Encounter (Signed)
Reviewed with Dr. Oscar La, update provided, no records received to date.  PUS recommended.   Call returned to patient, left detailed message, ok per dpr. Advised as seen above per Dr. Oscar La. PUS scheduled for 8/3 at 1:30pm consult to follow. OV has been cancelled for 8/3 at 4:30pm. Return call to office to confirm message received. Advised office phones do go off at 4pm and return on at 8am tomorrow morning.    PUS order pended.

## 2020-04-06 NOTE — Telephone Encounter (Signed)
Call placed to Dr. Lyndal Rainbow office to f/u on patients records request, was forwarded to [facility], left detailed message requesting records be faxed to Dr. Oscar La at [phone]. Per patient, patient signed a release for records to be sent.

## 2020-04-06 NOTE — Telephone Encounter (Signed)
PUS order placed, routing to [facility] for precert.

## 2020-04-09 NOTE — Telephone Encounter (Signed)
Spoke with patient regarding benefits for recommended ultrasound. Patient is aware that ultrasound is transvaginal. Patient acknowledges understanding of information presented. Confirmed patient's appointment date and time. Patient is agreeable. Patient is aware of cancellation policy. Patient stated that she is headed to Dr. Lyndal Rainbow office to sign a release of records. Patient stated that she was told her records would get to [place] by tomorrow.  Confirmed with Edgardo Roys in records that we received records as of 04/09/2020 at 0831AM. Informed patient that records have been received. Patient is appreciative.

## 2020-04-10 ENCOUNTER — Ambulatory Visit: Payer: Self-pay | Admitting: Obstetrics and Gynecology

## 2020-04-10 ENCOUNTER — Ambulatory Visit: Payer: BC Managed Care – PPO | Admitting: Obstetrics and Gynecology

## 2020-04-10 ENCOUNTER — Encounter: Payer: Self-pay | Admitting: Obstetrics and Gynecology

## 2020-04-10 ENCOUNTER — Telehealth: Payer: Self-pay | Admitting: *Deleted

## 2020-04-10 ENCOUNTER — Other Ambulatory Visit: Payer: Self-pay

## 2020-04-10 ENCOUNTER — Ambulatory Visit (INDEPENDENT_AMBULATORY_CARE_PROVIDER_SITE_OTHER): Payer: BC Managed Care – PPO

## 2020-04-10 VITALS — BP 126/74 | HR 88 | Ht 65.0 in | Wt 144.0 lb

## 2020-04-10 DIAGNOSIS — Z3201 Encounter for pregnancy test, result positive: Secondary | ICD-10-CM | POA: Diagnosis not present

## 2020-04-10 DIAGNOSIS — N912 Amenorrhea, unspecified: Secondary | ICD-10-CM | POA: Diagnosis not present

## 2020-04-10 DIAGNOSIS — Z3687 Encounter for antenatal screening for uncertain dates: Secondary | ICD-10-CM | POA: Diagnosis not present

## 2020-04-10 DIAGNOSIS — O021 Missed abortion: Secondary | ICD-10-CM | POA: Diagnosis not present

## 2020-04-10 MED ORDER — ONDANSETRON HCL 4 MG PO TABS: 4.0000 mg | ORAL_TABLET | Freq: Three times a day (TID) | ORAL | 0 refills | Status: DC | PRN

## 2020-04-10 MED ORDER — MISOPROSTOL 200 MCG PO TABS: ORAL_TABLET | ORAL | 1 refills | Status: DC

## 2020-04-10 NOTE — Patient Instructions (Signed)
Managing Pregnancy Loss Pregnancy loss can happen any time during a pregnancy. Often the cause is not known. It is rarely because of anything you did. Pregnancy loss in early pregnancy (during the first trimester) is called a miscarriage. This type of pregnancy loss is the most common. Pregnancy loss that happens after 20 weeks of pregnancy is called fetal demise if the baby's heart stops beating before birth. Fetal demise is much less common. Some women experience spontaneous labor shortly after fetal demise resulting in a stillborn birth (stillbirth). Any pregnancy loss can be devastating. You will need to recover both physically and emotionally. Most women are able to get pregnant again after a pregnancy loss and deliver a healthy baby. How to manage emotional recovery  Pregnancy loss is very hard emotionally. You may feel many different emotions while you grieve. You may feel sad and angry. You may also feel guilty. It is normal to have periods of crying. Emotional recovery can take longer than physical recovery. It is different for everyone. Taking these steps can help you in managing this loss:  Remember that it is unlikely you did anything to cause the pregnancy loss.  Share your thoughts and feelings with friends, family, and your partner. Remember that your partner is also recovering emotionally.  Make sure you have a good support system. Do not spend too much time alone.  Meet with a pregnancy loss counselor or join a pregnancy loss support group.  Get enough sleep and eat a healthy diet. Return to regular exercise when you have recovered physically.  Do not use drugs or alcohol to manage your emotions.  Consider seeing a mental health professional to help you recover emotionally.  Ask a friend or loved one to help you decide what to do with any clothing and nursery items you received for your baby. In the case of a stillbirth, many women benefit from taking additional steps in the  grieving process. You may want to:  Hold your baby after the birth.  Name your baby.  Request a birth certificate.  Create a keepsake such as handprints or footprints.  Dress your baby and have a picture taken.  Make funeral arrangements.  Ask for a baptism or blessing. Hospitals have staff members who can help you with all these arrangements. How to recognize emotional stress It is normal to have emotional stress after a pregnancy loss. But emotional stress that lasts a long time or becomes severe requires treatment. Watch out for these signs of severe emotional stress:  Sadness, anger, or guilt that is not going away and is interfering with your normal activities.  Relationship problems that have occurred or gotten worse since the pregnancy loss.  Signs of depression that last longer than 2 weeks. These may include: ? Sadness. ? Anxiety. ? Hopelessness. ? Loss of interest in activities you enjoy. ? Inability to concentrate. ? Trouble sleeping or sleeping too much. ? Loss of appetite or overeating. ? Thoughts of death or of hurting yourself. Follow these instructions at home:  Take over-the-counter and prescription medicines only as told by your health care provider.  Rest at home until your energy level returns. Return to your normal activities as told by your health care provider. Ask your health care provider what activities are safe for you.  When you are ready, meet with your health care provider to discuss steps to take for a future pregnancy.  Keep all follow-up visits as told by your health care provider. This is important.  Where to find support  To help you and your partner with the process of grieving, talk with your health care provider or seek counseling.  Consider meeting with others who have experienced pregnancy loss. Ask your health care provider about support groups and resources. Where to find more information  [place].S. Department of Health and Human  Services Office on Women's Health: www.womenshealth.gov  American Pregnancy Association: www.americanpregnancy.org Contact a health care provider if:  You continue to experience grief, sadness, or lack of motivation for everyday activities, and those feelings do not improve over time.  You are struggling to recover emotionally, especially if you are using alcohol or substances to help. Get help right away if:  You have thoughts of hurting yourself or others. If you ever feel like you may hurt yourself or others, or have thoughts about taking your own life, get help right away. You can go to your nearest emergency department or call:  Your local emergency services (911 in the [place]  A suicide crisis helpline, such as the National Suicide Prevention Lifeline at 1-800-273-8255. This is open 24 hours a day. Summary  Any pregnancy loss can be difficult physically and emotionally.  You may experience many different emotions while you grieve. Emotional recovery can last longer than physical recovery.  It is normal to have emotional stress after a pregnancy loss. But emotional stress that lasts a long time or becomes severe requires treatment.  See your health care provider if you are struggling emotionally after a pregnancy loss. This information is not intended to replace advice given to you by your health care provider. Make sure you discuss any questions you have with your health care provider. Document Revised: 12/15/2018 Document Reviewed: 11/05/2017 Elsevier Patient Education  2020 Elsevier Inc.      Miscarriage A miscarriage is the loss of an unborn baby (fetus) before the 20th week of pregnancy. Most miscarriages happen during the first 3 months of pregnancy. Sometimes, a miscarriage can happen before a woman knows that she is pregnant. Having a miscarriage can be an emotional experience. If you have had a miscarriage, talk with your health care provider about any questions you  may have about miscarrying, the grieving process, and your plans for future pregnancy. What are the causes? A miscarriage may be caused by:  Problems with the genes or chromosomes of the fetus. These problems make it impossible for the baby to develop normally. They are often the result of random errors that occur early in the development of the baby, and are not passed from parent to child (not inherited).  Infection of the cervix or uterus.  Conditions that affect hormone balance in the body.  Problems with the cervix, such as the cervix opening and thinning before pregnancy is at term (cervical insufficiency).  Problems with the uterus. These may include: ? A uterus with an abnormal shape. ? Fibroids in the uterus. ? Congenital abnormalities. These are problems that were present at birth.  Certain medical conditions.  Smoking, drinking alcohol, or using drugs.  Injury (trauma). In many cases, the cause of a miscarriage is not known. What are the signs or symptoms? Symptoms of this condition include:  Vaginal bleeding or spotting, with or without cramps or pain.  Pain or cramping in the abdomen or lower back.  Passing fluid, tissue, or blood clots from the vagina. How is this diagnosed? This condition may be diagnosed based on:  A physical exam.  Ultrasound.  Blood tests.  Urine tests. How   is this treated? Treatment for a miscarriage is sometimes not necessary if you naturally pass all the tissue that was in your uterus. If necessary, this condition may be treated with:  Dilation and curettage (D&C). This is a procedure in which the cervix is stretched open and the lining of the uterus (endometrium) is scraped. This is done only if tissue from the fetus or placenta remains in the body (incomplete miscarriage).  Medicines, such as: ? Antibiotic medicine, to treat infection. ? Medicine to help the body pass any remaining tissue. ? Medicine to reduce (contract) the  size of the uterus. These medicines may be given if you have a lot of bleeding. If you have Rh negative blood and your baby was Rh positive, you will need a shot of a medicine called Rh immunoglobulinto protect your future babies from Rh blood problems. "Rh-negative" and "Rh-positive" refer to whether or not the blood has a specific protein found on the surface of red blood cells (Rh factor). Follow these instructions at home: Medicines   Take over-the-counter and prescription medicines only as told by your health care provider.  If you were prescribed antibiotic medicine, take it as told by your health care provider. Do not stop taking the antibiotic even if you start to feel better.  Do not take NSAIDs, such as aspirin and ibuprofen, unless they are approved by your health care provider. These medicines can cause bleeding. Activity  Rest as directed. Ask your health care provider what activities are safe for you.  Have someone help with home and family responsibilities during this time. General instructions  Keep track of the number of sanitary pads you use each day and how soaked (saturated) they are. Write down this information.  Monitor the amount of tissue or blood clots that you pass from your vagina. Save any large amounts of tissue for your health care provider to examine.  Do not use tampons, douche, or have sex until your health care provider approves.  To help you and your partner with the process of grieving, talk with your health care provider or seek counseling.  When you are ready, meet with your health care provider to discuss any important steps you should take for your health. Also, discuss steps you should take to have a healthy pregnancy in the future.  Keep all follow-up visits as told by your health care provider. This is important. Where to find more information  The American Congress of Obstetricians and Gynecologists: www.acog.org  [place].S. Department of Health  and Human Services Office of Women's Health: www.womenshealth.gov Contact a health care provider if:  You have a fever or chills.  You have a foul smelling vaginal discharge.  You have more bleeding instead of less. Get help right away if:  You have severe cramps or pain in your back or abdomen.  You pass blood clots or tissue from your vagina that is walnut-sized or larger.  You soak more than 1 regular sanitary pad in an hour.  You become light-headed or weak.  You pass out.  You have feelings of sadness that take over your thoughts, or you have thoughts of hurting yourself. Summary  Most miscarriages happen in the first 3 months of pregnancy. Sometimes miscarriage happens before a woman even knows that she is pregnant.  Follow your health care provider's instruction for home care. Keep all follow-up appointments.  To help you and your partner with the process of grieving, talk with your health care provider or seek   intended to replace advice given to you by your health care provider. Make sure you discuss any questions you have with your health care provider. Document Revised: 12/17/2018 Document Reviewed: 09/30/2016 Elsevier Patient Education  2020 Elsevier Inc.  

## 2020-04-10 NOTE — Progress Notes (Signed)
GYNECOLOGY  VISIT   HPI: 39 y.o.   Single   Unavailable American [place] or [place] Native Not Hispanic or Latino  female   [phone] with No LMP recorded.   here for   The patient was seen at [facility] for a consultation on 03/19/20 with a 6 month h/o infertility. She was found to have an incidental IUP at 6+ weeks with no FHR. The plan was for her to f/u in one week for a repeat ultrasound. Options of fertility treatment was reviewed.  She was unable to stay for her f/u appointment (had to leave after a long wait). She isn't having any bleeding or pain.   GYNECOLOGIC HISTORY: No LMP recorded. Contraception:none Menopausal hormone therapy: no        OB History    Gravida  1   Para  1   Term  1   Preterm  0   AB  0   Living  1     SAB  0   TAB  0   Ectopic  0   Multiple  0   Live Births  1              There are no problems to display for this patient.   Past Medical History:  Diagnosis Date  . Abnormal Pap smear of cervix ~2002 & ~ 2006   COLPO biopsy with first abnormal  . Anxiety   . Depression   . History of IBS     Past Surgical History:  Procedure Laterality Date  . AUGMENTATION MAMMAPLASTY     implants  . BREAST SURGERY    . CESAREAN SECTION  01/1999  . CYST EXCISION  2006   seb. cyst upper back   . CYST EXCISION Left 2010   left buttock  . INTRAUTERINE DEVICE INSERTION  08/22/13   Mirena, first Mirena inserted 08/2008  . LAPAROSCOPIC CHOLECYSTECTOMY    . REFRACTIVE SURGERY      Current Outpatient Medications  Medication Sig Dispense Refill  . DULoxetine (CYMBALTA) 60 MG capsule Take 60 mg by mouth daily.    Marland Kitchen LINZESS 72 MCG capsule TAKE 1 CAPSULE BY MOUTH ONCE DAILY FOR 90 DAYS    . phenazopyridine (PYRIDIUM) 200 MG tablet Take 1 tablet (200 mg total) by mouth 3 (three) times daily as needed. 6 tablet 0  . sulfamethoxazole-trimethoprim (BACTRIM DS) 800-160 MG tablet Take 1 tablet by mouth 2 (two) times daily. One PO BID  x 3 days 6 tablet 0   No current facility-administered medications for this visit.     ALLERGIES: Percocet [oxycodone-acetaminophen]  Family History  Problem Relation Age of Onset  . Cancer Father 58       Prostate   . Hypertension Maternal Grandmother   . Cancer Maternal Grandfather     Social History   Socioeconomic History  . Marital status: Single    Spouse name: Not on file  . Number of children: Not on file  . Years of education: Not on file  . Highest education level: Not on file  Occupational History  . Not on file  Tobacco Use  . Smoking status: Never Smoker  . Smokeless tobacco: Never Used  Vaping Use  . Vaping Use: Never used  Substance and Sexual Activity  . Alcohol use: Yes    Comment: socially  . Drug use: No  . Sexual activity: Yes    Partners: Male  Other Topics Concern  . Not on file  Social History Narrative  . Not on file   Social Determinants of Health   Financial Resource Strain:   . Difficulty of Paying Living Expenses:   Food Insecurity:   . Worried About Programme researcher, broadcasting/film/video in the Last Year:   . Barista in the Last Year:   Transportation Needs:   . Freight forwarder (Medical):   Marland Kitchen Lack of Transportation (Non-Medical):   Physical Activity:   . Days of Exercise per Week:   . Minutes of Exercise per Session:   Stress:   . Feeling of Stress :   Social Connections:   . Frequency of Communication with Friends and Family:   . Frequency of Social Gatherings with Friends and Family:   . Attends Religious Services:   . Active Member of Clubs or Organizations:   . Attends Banker Meetings:   Marland Kitchen Marital Status:   Intimate Partner Violence:   . Fear of Current or Ex-Partner:   . Emotionally Abused:   Marland Kitchen Physically Abused:   . Sexually Abused:     ROS  PHYSICAL EXAMINATION:    There were no vitals taken for this visit.    General appearance: alert, cooperative and appears stated age   Ultrasound: no viable  embryo, gest sac size c/w 7-8 weeks  ASSESSMENT Missed abortion H/O secondary infertility     PLAN Discussed options of expectant management, medical termination or D&E.  She prefers Risk analyst. Risks reviewed. Cytotec 200 mcg, place 4 tablets vaginally x 1. Can repeat in 24 hours if needed. Zofran for nausea Tylenol for pain, declines oxycodone Call if she is saturating more than one pad an hour for more than one hour in a row, or with any concerns.  F/U in 2 weeks for a repeat ultrasound BhcG and type and rh sent  Recommend that she make an appointment at the Infertility clinic for future plans.  In addition to reviewing the ultrasound report, over 15 minutes was spent in total patient care, mostly management.

## 2020-04-10 NOTE — Telephone Encounter (Signed)
Spoke with patient. Patient was seen in the office today for missed abortion. She started spotting on her own today, asking if Cytotec still recommended. Denies pain or cramping. Advised I will review with Dr. Oscar La and return call, patient agreeable.    Reviewed with Dr. Oscar La -patients choice, ok to proceed naturally or can take cytotec as prescribed.   Call returned to patient, advised per Dr. Oscar La. Patient plans to take one dose of Cytotec as prescribed, is aware to contact the office if she has any additional questions/concerns. Patient verbalizes understanding and thankful for call.   Questions answered.   Encounter closed.

## 2020-04-11 ENCOUNTER — Other Ambulatory Visit: Payer: Self-pay

## 2020-04-11 ENCOUNTER — Telehealth: Payer: Self-pay | Admitting: *Deleted

## 2020-04-11 DIAGNOSIS — O021 Missed abortion: Secondary | ICD-10-CM

## 2020-04-11 LAB — ABO AND RH: Rh Factor: POSITIVE

## 2020-04-11 LAB — BETA HCG QUANT (REF LAB): hCG Quant: 5418 m[IU]/mL

## 2020-04-11 NOTE — Telephone Encounter (Signed)
Leda Min, RN  04/11/2020 10:19 AM EDT Back to Top    Left message to call Noreene Larsson, RN at [facility] [phone].

## 2020-04-11 NOTE — Progress Notes (Signed)
Spoke with pt. Pt given recommendations per Dr Oscar La for follow up PUS in 2 weeks. Pt agreeable and verbalized understanding. Pt scheduled for PUS on 04/24/20 at 1 pm with OV consult to follow with Dr Reyne Dumas.  Orders placed for PUS  Cc: Hedda Slade for precert Encounter closed.

## 2020-04-11 NOTE — Telephone Encounter (Signed)
Patient returned call

## 2020-04-11 NOTE — Telephone Encounter (Signed)
-----   Message from Romualdo Bolk, MD sent at 04/11/2020  8:49 AM EDT ----- Please let the patient know that her blood type is O+, no shots needed. Her BhcG just gives me a baseline in case needed in the future. She took cytotec last night, please see how she is doing.

## 2020-04-11 NOTE — Telephone Encounter (Signed)
Spoke with pt. Pt given results update from Dr Oscar La. Pt verbalized understanding. Pt states took Cytotec around 6 pm last night and started having severe abd pain/cramps, heavy bleeding with tissue and clots, nausea and vomiting around 8pm and pain that lasted until 1am. Pt states took Tylenol PM that helped her rest and fall asleep. Pt states only having light bleeding today when using bathroom. Pt advised if had heavy bleeding, clots, feeling weak, dizzy, lightheaded to be seen in ER. Pt agreeable.   Routing to Dr Oscar La for update per request Encounter closed.

## 2020-04-16 ENCOUNTER — Encounter: Payer: Self-pay | Admitting: Obstetrics and Gynecology

## 2020-04-22 ENCOUNTER — Telehealth: Payer: Self-pay | Admitting: Obstetrics and Gynecology

## 2020-04-22 NOTE — Telephone Encounter (Signed)
I got a copy of her BhcG from 04/16/20 from [facility]. Her BhcG from 04/10/20 dropped to 446 on 04/16/20 s/p SAB. This is a good drop. She is scheduled for f/u ultrasound on 04/24/20. Please check on her.

## 2020-04-23 NOTE — Telephone Encounter (Signed)
Spoke with patient, advised per Dr. Oscar La. Patient reports she [place] doing well. Denies any pain or bleeding. Will see Dr. Oscar La for PUS on 04/24/20 at 1pm, consult to follow at 1:30pm. Patient verbalizes understanding and is agreeable.   Encounter closed.

## 2020-04-23 NOTE — Telephone Encounter (Signed)
Patient is returning call.  °

## 2020-04-23 NOTE — Telephone Encounter (Signed)
Left message to call Oakland Fant, RN at [facility] [phone].   

## 2020-04-24 ENCOUNTER — Other Ambulatory Visit: Payer: Self-pay | Admitting: Obstetrics and Gynecology

## 2020-04-24 ENCOUNTER — Ambulatory Visit (INDEPENDENT_AMBULATORY_CARE_PROVIDER_SITE_OTHER): Payer: BC Managed Care – PPO

## 2020-04-24 ENCOUNTER — Encounter: Payer: Self-pay | Admitting: Obstetrics and Gynecology

## 2020-04-24 ENCOUNTER — Other Ambulatory Visit: Payer: Self-pay

## 2020-04-24 ENCOUNTER — Telehealth: Payer: Self-pay

## 2020-04-24 ENCOUNTER — Ambulatory Visit: Payer: BC Managed Care – PPO | Admitting: Obstetrics and Gynecology

## 2020-04-24 VITALS — BP 108/68 | HR 68 | Temp 97.9°F

## 2020-04-24 DIAGNOSIS — O034 Incomplete spontaneous abortion without complication: Secondary | ICD-10-CM

## 2020-04-24 DIAGNOSIS — Z3687 Encounter for antenatal screening for uncertain dates: Secondary | ICD-10-CM | POA: Diagnosis not present

## 2020-04-24 DIAGNOSIS — R8290 Unspecified abnormal findings in urine: Secondary | ICD-10-CM | POA: Diagnosis not present

## 2020-04-24 DIAGNOSIS — O021 Missed abortion: Secondary | ICD-10-CM

## 2020-04-24 LAB — POCT URINALYSIS DIPSTICK
Bilirubin, UA: NEGATIVE
Blood, UA: NEGATIVE
Glucose, UA: NEGATIVE
Ketones, UA: NEGATIVE
Leukocytes, UA: NEGATIVE
Nitrite, UA: NEGATIVE
Protein, UA: NEGATIVE
Spec Grav, UA: 1.01 (ref 1.010–1.025)
Urobilinogen, UA: 0.2 E.U./dL
pH, UA: 5 (ref 5.0–8.0)

## 2020-04-24 MED ORDER — MISOPROSTOL 200 MCG PO TABS: ORAL_TABLET | ORAL | 0 refills | Status: DC

## 2020-04-24 MED ORDER — HYDROMORPHONE HCL 2 MG PO TABS: 2.0000 mg | ORAL_TABLET | ORAL | 0 refills | Status: DC | PRN

## 2020-04-24 NOTE — Telephone Encounter (Signed)
Spoke with Dr Oscar La. Dr Oscar La sent Rx for Dilaudid via electronically, paper Rx shredded.  Call placed to pharmacy to make aware of sent Rx.  Encounter closed.

## 2020-04-24 NOTE — Progress Notes (Signed)
GYNECOLOGY  VISIT   HPI: 39 y.o.   Single   Unavailable American [place] or [place] Native Not Hispanic or Latino  female   [phone] with Patient's last menstrual period was 02/14/2020.   here for consult after PUS. She was treated with misoprostol 2 weeks ago for a missed ab. She had heavy bleeding and cramping after taking the misoprostol and is currently feeling fine. Only bleeding heavily for one day, bleed like a normal cycle for 5 days. No bleeding since then. No fever, no pain.  BhcG from 04/10/20 was 5,418, it was down to 446 on 04/16/20.    BT O+  She c/o urinary odor for 4 days, no frequency, urgency or dysuria. No vaginal odor.   GYNECOLOGIC HISTORY: Patient's last menstrual period was 02/14/2020. Contraception: None Menopausal hormone therapy: none        OB History    Gravida  1   Para  1   Term  1   Preterm  0   AB  0   Living  1     SAB  0   TAB  0   Ectopic  0   Multiple  0   Live Births  1              There are no problems to display for this patient.   Past Medical History:  Diagnosis Date  . Abnormal Pap smear of cervix ~2002 & ~ 2006   COLPO biopsy with first abnormal  . Anxiety   . Depression   . History of IBS     Past Surgical History:  Procedure Laterality Date  . AUGMENTATION MAMMAPLASTY     implants  . BREAST SURGERY    . CESAREAN SECTION  01/1999  . CYST EXCISION  2006   seb. cyst upper back   . CYST EXCISION Left 2010   left buttock  . INTRAUTERINE DEVICE INSERTION  08/22/13   Mirena, first Mirena inserted 08/2008  . LAPAROSCOPIC CHOLECYSTECTOMY    . REFRACTIVE SURGERY      Current Outpatient Medications  Medication Sig Dispense Refill  . DULoxetine (CYMBALTA) 60 MG capsule Take 60 mg by mouth daily.     No current facility-administered medications for this visit.     ALLERGIES: Percocet [oxycodone-acetaminophen]  Family History  Problem Relation Age of Onset  . Cancer Father 39       Prostate   .  Hypertension Maternal Grandmother   . Cancer Maternal Grandfather     Social History   Socioeconomic History  . Marital status: Single    Spouse name: Not on file  . Number of children: Not on file  . Years of education: Not on file  . Highest education level: Not on file  Occupational History  . Not on file  Tobacco Use  . Smoking status: Never Smoker  . Smokeless tobacco: Never Used  Vaping Use  . Vaping Use: Never used  Substance and Sexual Activity  . Alcohol use: Yes    Comment: socially  . Drug use: No  . Sexual activity: Yes    Partners: Male  Other Topics Concern  . Not on file  Social History Narrative  . Not on file   Social Determinants of Health   Financial Resource Strain:   . Difficulty of Paying Living Expenses:   Food Insecurity:   . Worried About Programme researcher, broadcasting/film/video in the Last Year:   . [employer] of Food in the Last  Year:   Transportation Needs:   . Freight forwarder (Medical):   Marland Kitchen Lack of Transportation (Non-Medical):   Physical Activity:   . Days of Exercise per Week:   . Minutes of Exercise per Session:   Stress:   . Feeling of Stress :   Social Connections:   . Frequency of Communication with Friends and Family:   . Frequency of Social Gatherings with Friends and Family:   . Attends Religious Services:   . Active Member of Clubs or Organizations:   . Attends Banker Meetings:   Marland Kitchen Marital Status:   Intimate Partner Violence:   . Fear of Current or Ex-Partner:   . Emotionally Abused:   Marland Kitchen Physically Abused:   . Sexually Abused:     Review of Systems  Constitutional: Negative.   HENT: Negative.   Eyes: Negative.   Respiratory: Negative.   Cardiovascular: Negative.   Gastrointestinal: Negative.   Genitourinary:       Urinary odor  Musculoskeletal: Negative.   Skin: Negative.   Neurological: Negative.   Endo/Heme/Allergies: Negative.   Psychiatric/Behavioral: Negative.     PHYSICAL EXAMINATION:    BP 108/68  (BP Location: Right Arm, Patient Position: Sitting, Cuff Size: Normal)   Pulse 68   Temp 97.9 F (36.6 C) (Skin)   LMP 02/14/2020     General appearance: alert, cooperative and appears stated age   Pelvic: External genitalia:  no lesions              Urethra:  normal appearing urethra with no masses, tenderness or lesions              Bartholins and Skenes: normal                 Vagina: normal appearing vagina with normal color and discharge, no lesions              Cervix: no cervical motion tenderness and no lesions              Bimanual Exam:  Uterus:  normal size, contour, position, consistency, mobility, non-tender              Adnexa: no mass, fullness, tenderness              Bladder: not tender  Chaperone was present for exam.  Ultrasound images were reviewed with the patient.   ASSESSMENT H/O missed AB, s/p treatment with misoprostol. She had a expected bleed, drop in BhcG, but imaging today is consistent with retained product of conception. No fever, no pain, not tender on exam. Urine odor, negative dip    PLAN Discussed option of expectant management, retaking the misoprostol and D&C. Will check BhcG today She will retry the misoprostol. Will pretreat with Zofran and Dilaudid. She still has Zofran left from last time. Can take tylenol.  Will likely f/u her BhcG, prior to deciding on another ultrasound.  Send urine for ua, c&s   The majority of the visit was around management of unexpected ultrasound findings. Need for medication and further laboratory studies.

## 2020-04-24 NOTE — Telephone Encounter (Signed)
Pharmacy called in regards of a prescription of Dilaudid. Pharmacy states they have to receive an electronic prescription not a manual fax and to please resend electronically.

## 2020-04-24 NOTE — Addendum Note (Signed)
Addended by: Tobi Bastos on: 04/24/2020 03:45 PM   Modules accepted: Orders

## 2020-04-25 LAB — URINALYSIS, MICROSCOPIC ONLY
Bacteria, UA: NONE SEEN
Casts: NONE SEEN /lpf
RBC, Urine: NONE SEEN /hpf (ref 0–2)
WBC, UA: NONE SEEN /hpf (ref 0–5)

## 2020-04-25 LAB — BETA HCG QUANT (REF LAB): hCG Quant: 72 m[IU]/mL

## 2020-04-26 LAB — URINE CULTURE

## 2020-04-30 ENCOUNTER — Telehealth: Payer: Self-pay

## 2020-04-30 ENCOUNTER — Other Ambulatory Visit (INDEPENDENT_AMBULATORY_CARE_PROVIDER_SITE_OTHER): Payer: BC Managed Care – PPO

## 2020-04-30 ENCOUNTER — Other Ambulatory Visit: Payer: Self-pay

## 2020-04-30 DIAGNOSIS — O034 Incomplete spontaneous abortion without complication: Secondary | ICD-10-CM

## 2020-04-30 DIAGNOSIS — O021 Missed abortion: Secondary | ICD-10-CM

## 2020-04-30 NOTE — Telephone Encounter (Signed)
Spoke with pt. Pt given recommendations per Dr Oscar La. Pt agreeable. Pt scheduled for lab today 8/23 at 345 pm. Pt agreeable and verbalized understanding of date and time of appt.   Encounter closed. Future lab orders placed.

## 2020-04-30 NOTE — Telephone Encounter (Signed)
Spoke with pt. Pt states was seen last week on 04/24/20 with Dr Oscar La after h/o missed AB. Pt states took Cytotec as directed on Saturday and had some cramping, but no bleeding at all. Denies any vaginal bleeding or spotting.  Advised pt will give update to Dr Oscar La and return call with plan of care. Pt agreeable.   Routing to Dr Oscar La.

## 2020-04-30 NOTE — Telephone Encounter (Signed)
Please have her come in for another BhcG (not stat)

## 2020-04-30 NOTE — Telephone Encounter (Signed)
Patient left message regarding wanting to speak with nurse, no further info given.

## 2020-04-30 NOTE — Telephone Encounter (Signed)
Patient returned call

## 2020-05-01 LAB — BETA HCG QUANT (REF LAB): hCG Quant: 19 m[IU]/mL

## 2020-05-07 ENCOUNTER — Other Ambulatory Visit: Payer: Self-pay | Admitting: *Deleted

## 2020-05-07 ENCOUNTER — Other Ambulatory Visit (INDEPENDENT_AMBULATORY_CARE_PROVIDER_SITE_OTHER): Payer: BC Managed Care – PPO

## 2020-05-07 ENCOUNTER — Other Ambulatory Visit: Payer: Self-pay

## 2020-05-07 DIAGNOSIS — O034 Incomplete spontaneous abortion without complication: Secondary | ICD-10-CM

## 2020-05-07 DIAGNOSIS — O021 Missed abortion: Secondary | ICD-10-CM

## 2020-05-08 ENCOUNTER — Telehealth: Payer: Self-pay | Admitting: *Deleted

## 2020-05-08 DIAGNOSIS — O021 Missed abortion: Secondary | ICD-10-CM

## 2020-05-08 DIAGNOSIS — O034 Incomplete spontaneous abortion without complication: Secondary | ICD-10-CM

## 2020-05-08 LAB — BETA HCG QUANT (REF LAB): hCG Quant: 8 m[IU]/mL

## 2020-05-08 NOTE — Telephone Encounter (Signed)
Spoke with patient, advised per Dr. Oscar La.  Patient reports she is still spotting. Denies heavy bleeding, pain fever/chills.  Lab appt scheduled for 05/15/20 at 3:45pm. Patient declines morning appt.  Advised I will provide update to Dr. Oscar La and return call if any additional recommendations. Patient agreeable.   Future lab order placed for BhcG, routine.   Routing to Dr. Oscar La for update.

## 2020-05-08 NOTE — Telephone Encounter (Signed)
Leda Min, RN  05/08/2020 12:56 PM EDT Back to Top    Left message to call Noreene Larsson, RN at [facility] [phone].

## 2020-05-08 NOTE — Telephone Encounter (Signed)
-----   Message from Romualdo Bolk, MD sent at 05/08/2020 12:54 PM EDT ----- Her BhcG is dropping very slowly, but is dropping. I would recommend checking it again in one week. See if she has had any bleeding.

## 2020-05-15 ENCOUNTER — Telehealth: Payer: Self-pay | Admitting: Obstetrics and Gynecology

## 2020-05-15 ENCOUNTER — Other Ambulatory Visit: Payer: Self-pay

## 2020-05-15 NOTE — Telephone Encounter (Signed)
After reviewing with Dr Oscar La. Pt to come for repeat lab tomorrow.  Spoke with pt. Pt given update on lab work. Pt agreeable to have beta Hcg on 05/16/20 at 345 pm. Pt verbalized understanding.  Encounter closed.

## 2020-05-15 NOTE — Telephone Encounter (Signed)
Spoke with pt. Pt states started having brown bleeding/discharge today. Pt states dark brown blood and she is wearing a pad changing only twice today. Pt has scheduled beta HCG today. Pt states first cycle since miscarriage.   Advised will cancel lab  today and review with Dr Oscar La when she is back in office and return call to pt. Pt agreeable.   Routing to Dr Oscar La.

## 2020-05-15 NOTE — Telephone Encounter (Signed)
Patient is calling because she is spotting and has an appointment for labs today. She wants to know if she should wait another to do labs.

## 2020-05-16 ENCOUNTER — Other Ambulatory Visit (INDEPENDENT_AMBULATORY_CARE_PROVIDER_SITE_OTHER): Payer: BC Managed Care – PPO

## 2020-05-16 ENCOUNTER — Other Ambulatory Visit: Payer: Self-pay

## 2020-05-16 DIAGNOSIS — O021 Missed abortion: Secondary | ICD-10-CM

## 2020-05-16 DIAGNOSIS — O034 Incomplete spontaneous abortion without complication: Secondary | ICD-10-CM

## 2020-05-17 LAB — BETA HCG QUANT (REF LAB): hCG Quant: 4 m[IU]/mL

## 2020-05-21 ENCOUNTER — Telehealth: Payer: Self-pay | Admitting: Obstetrics and Gynecology

## 2020-05-21 NOTE — Telephone Encounter (Signed)
Spoke with patient. 05/16/20 beta hcg negative. Patient states she attempted to reschedule consult with Dr. April Manson, needs "clearance" to return. Advised I will review with Dr. Oscar La and f/u with [facility]. Patient agreeable.   Reviewed w/ Dr. 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   Call placed to [facility], left message with Cheri Guppy requesting return call to advise if new referral is needed for scheduling consult with Dr. April Manson. Return call to [place], RN at [facility] at [phone].

## 2020-05-21 NOTE — Telephone Encounter (Signed)
Patient called asking to follow up with Noreene Larsson, no further details given.

## 2020-05-21 NOTE — Telephone Encounter (Signed)
[facility] called in regards to a message that was left. Stated to fax patients most recent visit notes. Also stated that patient could call their office herself due to being seen within the past year.

## 2020-05-21 NOTE — Telephone Encounter (Signed)
04/10/20,  04/24/20 OV notes and recent Beta hCGs faxed to [facility] at [phone].   Call returned to patient, advised per [facility]. Advised OV notes and labs have been faxed, she should return call to [facility] to schedule. Return call to office if any additional questions. Patient verbalizes understanding and thankful for call.   Encounter closed.

## 2020-09-13 ENCOUNTER — Other Ambulatory Visit: Payer: Self-pay

## 2020-09-13 ENCOUNTER — Encounter: Payer: Self-pay | Admitting: Obstetrics and Gynecology

## 2020-09-13 ENCOUNTER — Ambulatory Visit (INDEPENDENT_AMBULATORY_CARE_PROVIDER_SITE_OTHER): Payer: BC Managed Care – PPO | Admitting: Obstetrics and Gynecology

## 2020-09-13 VITALS — BP 112/66 | HR 80 | Resp 18 | Ht 65.0 in | Wt 145.0 lb

## 2020-09-13 DIAGNOSIS — R311 Benign essential microscopic hematuria: Secondary | ICD-10-CM | POA: Insufficient documentation

## 2020-09-13 DIAGNOSIS — G4710 Hypersomnia, unspecified: Secondary | ICD-10-CM | POA: Insufficient documentation

## 2020-09-13 DIAGNOSIS — R3915 Urgency of urination: Secondary | ICD-10-CM | POA: Diagnosis not present

## 2020-09-13 DIAGNOSIS — J301 Allergic rhinitis due to pollen: Secondary | ICD-10-CM | POA: Insufficient documentation

## 2020-09-13 DIAGNOSIS — K219 Gastro-esophageal reflux disease without esophagitis: Secondary | ICD-10-CM | POA: Insufficient documentation

## 2020-09-13 DIAGNOSIS — Z8616 Personal history of COVID-19: Secondary | ICD-10-CM | POA: Insufficient documentation

## 2020-09-13 DIAGNOSIS — Z87440 Personal history of urinary (tract) infections: Secondary | ICD-10-CM

## 2020-09-13 DIAGNOSIS — F419 Anxiety disorder, unspecified: Secondary | ICD-10-CM | POA: Insufficient documentation

## 2020-09-13 DIAGNOSIS — K589 Irritable bowel syndrome without diarrhea: Secondary | ICD-10-CM | POA: Insufficient documentation

## 2020-09-13 DIAGNOSIS — G479 Sleep disorder, unspecified: Secondary | ICD-10-CM | POA: Insufficient documentation

## 2020-09-13 MED ORDER — PHENAZOPYRIDINE HCL 200 MG PO TABS: 200.0000 mg | ORAL_TABLET | Freq: Three times a day (TID) | ORAL | 0 refills | Status: DC | PRN

## 2020-09-13 MED ORDER — SULFAMETHOXAZOLE-TRIMETHOPRIM 800-160 MG PO TABS: 1.0000 | ORAL_TABLET | Freq: Two times a day (BID) | ORAL | 0 refills | Status: DC

## 2020-09-13 NOTE — Patient Instructions (Signed)

## 2020-09-13 NOTE — Progress Notes (Signed)
GYNECOLOGY  VISIT   HPI: 40 y.o.   Single   Unavailable American [place] or [place] Native Not Hispanic or Latino  female   [phone] with Patient's last menstrual period was 08/28/2020.   here for UTI. Patient states that she has had symptoms of urgency, feeling like she is not emptying enough. Her symptoms started prior to Christmas, she tried azo. Symptoms improved some, still with urgency to void, having to double void, no dysuria. Not currently having frequency. No fevers, no flank pain, some pain in her lower back.  She is trying to get pregnant since her sab in 8/21. She is seeing the fertility specialist. They did one cycle of IUI. Will try to get pregnant on their own until March, if not pregnant by then they will do IVF. They are having frequent intercourse, this typically leads to UTI's for her. She has a h/o frequent UTI's.  GYNECOLOGIC HISTORY: Patient's last menstrual period was 08/28/2020. Contraception: none Menopausal hormone therapy: n/a        OB History    Gravida  1   Para  1   Term  1   Preterm  0   AB  0   Living  1     SAB  0   IAB  0   Ectopic  0   Multiple  0   Live Births  1              Patient Active Problem List   Diagnosis Date Noted  . Allergic rhinitis due to pollen 09/13/2020  . Anxiety 09/13/2020  . 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 Excessive sleepiness 09/13/2020  . History of COVID-19 09/13/2020  . Irritable bowel syndrome 09/13/2020  . Laryngopharyngeal reflux 09/13/2020  . Sleep disturbance 09/13/2020    Past Medical History:  Diagnosis Date  . Abnormal Pap smear of cervix ~2002 & ~ 2006   COLPO biopsy with first abnormal  . Anxiety   . Depression   . History of IBS     Past Surgical History:  Procedure Laterality Date  . AUGMENTATION MAMMAPLASTY     implants  . BREAST SURGERY    . CESAREAN SECTION  01/1999  . CYST EXCISION  2006   seb. cyst upper back   . CYST EXCISION Left 2010   left  buttock  . INTRAUTERINE DEVICE INSERTION  08/22/13   Mirena, first Mirena inserted 08/2008  . LAPAROSCOPIC CHOLECYSTECTOMY    . REFRACTIVE SURGERY      Current Outpatient Medications  Medication Sig Dispense Refill  . DULoxetine (CYMBALTA) 60 MG capsule Take 60 mg by mouth daily.    Marland Kitchen linaclotide (LINZESS) 72 MCG capsule Take 72 mcg by mouth 3 (three) times daily.    Marland Kitchen omeprazole (PRILOSEC) 40 MG capsule 1 capsule 30 minutes before morning meal     No current facility-administered medications for this visit.     ALLERGIES: Percocet [oxycodone-acetaminophen] and Amitiza [lubiprostone]  Family History  Problem Relation Age of Onset  . Cancer Father 38       Prostate   . Hypertension Maternal Grandmother   . Cancer Maternal Grandfather     Social History   Socioeconomic History  . Marital status: Single    Spouse name: Not on file  . Number of children: Not on file  . Years of education: Not on file  . Highest education level: Not on file  Occupational History  . Not on file  Tobacco Use  .  Smoking status: Never Smoker  . Smokeless tobacco: Never Used  Vaping Use  . Vaping Use: Never used  Substance and Sexual Activity  . Alcohol use: Yes    Comment: socially  . Drug use: No  . Sexual activity: Yes    Partners: Male  Other Topics Concern  . Not on file  Social History Narrative  . Not on file   Social Determinants of Health   Financial Resource Strain: Not on file  Food Insecurity: Not on file  Transportation Needs: Not on file  Physical Activity: Not on file  Stress: Not on file  Social Connections: Not on file  Intimate Partner Violence: Not on file    Review of Systems  Constitutional: Negative.   HENT: Negative.   Eyes: Negative.   Respiratory: Negative.   Cardiovascular: Negative.   Gastrointestinal: Negative.   Genitourinary: Positive for dysuria and urgency.  Musculoskeletal: Positive for back pain.  Skin: Negative.   Neurological:  Negative.   Endo/Heme/Allergies: Negative.   Psychiatric/Behavioral: Negative.     PHYSICAL EXAMINATION:    BP 112/66 (BP Location: Right Arm, Patient Position: Sitting, Cuff Size: Normal)   Pulse 80   Resp 18   Ht 5\' 5"  (1.651 m)   Wt 145 lb (65.8 kg)   LMP 08/28/2020   BMI 24.13 kg/m     General appearance: alert, cooperative and appears stated age Abdomen: soft, mildly tender in the SP region; non distended, no masses,  no organomegaly CVA: not tender  Micro ua: 20-40 WBC, 0-2 RBC, 10-20 Sq. Epi, moderate bacteria.  1. Urgency of urination, needing to double void  H/O frequent UTI's, suspect UTI now - Urinalysis,Complete w/RFL Culture - sulfamethoxazole-trimethoprim (BACTRIM DS) 800-160 MG tablet; Take 1 tablet by mouth 2 (two) times daily. One PO BID x 3 days  Dispense: 6 tablet; Refill: 0 - phenazopyridine (PYRIDIUM) 200 MG tablet; Take 1 tablet (200 mg total) by mouth 3 (three) times daily as needed for pain.  Dispense: 10 tablet; Refill: 0  2. History of recurrent UTIs Sending urine for ua, c&s. If the culture is + that is her 3rd documented UTI in less than a year. Would recommend antibiotic prophylaxis with intercourse.  - sulfamethoxazole-trimethoprim (BACTRIM DS) 800-160 MG tablet; Take 1 tablet by mouth 2 (two) times daily. One PO BID x 3 days  Dispense: 6 tablet; Refill: 0 - phenazopyridine (PYRIDIUM) 200 MG tablet; Take 1 tablet (200 mg total) by mouth 3 (three) times daily as needed for pain.  Dispense: 10 tablet; Refill: 0

## 2020-09-15 LAB — URINALYSIS, COMPLETE W/RFL CULTURE
Bilirubin Urine: NEGATIVE
Glucose, UA: NEGATIVE
Hyaline Cast: NONE SEEN /LPF
Nitrites, Initial: NEGATIVE
Protein, ur: NEGATIVE
Specific Gravity, Urine: 1.025 (ref 1.001–1.03)
pH: 5.5 (ref 5.0–8.0)

## 2020-09-15 LAB — URINE CULTURE
MICRO NUMBER:: 11389734
SPECIMEN QUALITY:: ADEQUATE

## 2020-09-15 LAB — CULTURE INDICATED

## 2020-11-06 DIAGNOSIS — Z8616 Personal history of COVID-19: Secondary | ICD-10-CM

## 2020-11-06 HISTORY — DX: Personal history of COVID-19: Z86.16

## 2020-12-01 IMAGING — RF DG HYSTEROGRAM
1 series · 5 of 5 positions shown · IV contrast (omnipaque)
Comparison: None.

CLINICAL DATA: Secondary infertility. Prior C-section delivery.

EXAM:
HYSTEROSALPINGOGRAM
TECHNIQUE: Following cleansing of the cervix and vagina with Betadine solution,
a hysterosalpingogram was performed using a 5-French
hysterosalpingogram catheter and Omnipaque 300 contrast. The patient
tolerated the examination without difficulty.

[Series 1: one shot · 5 of 5 slices shown]
[im 1/5]
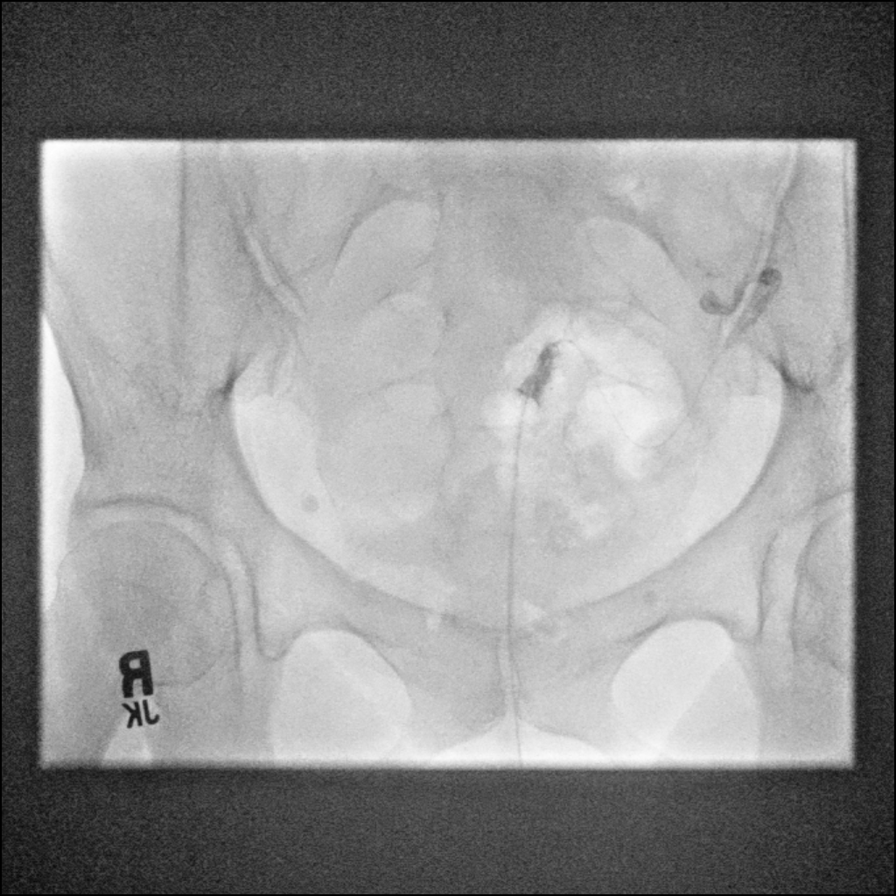
[im 2/5]
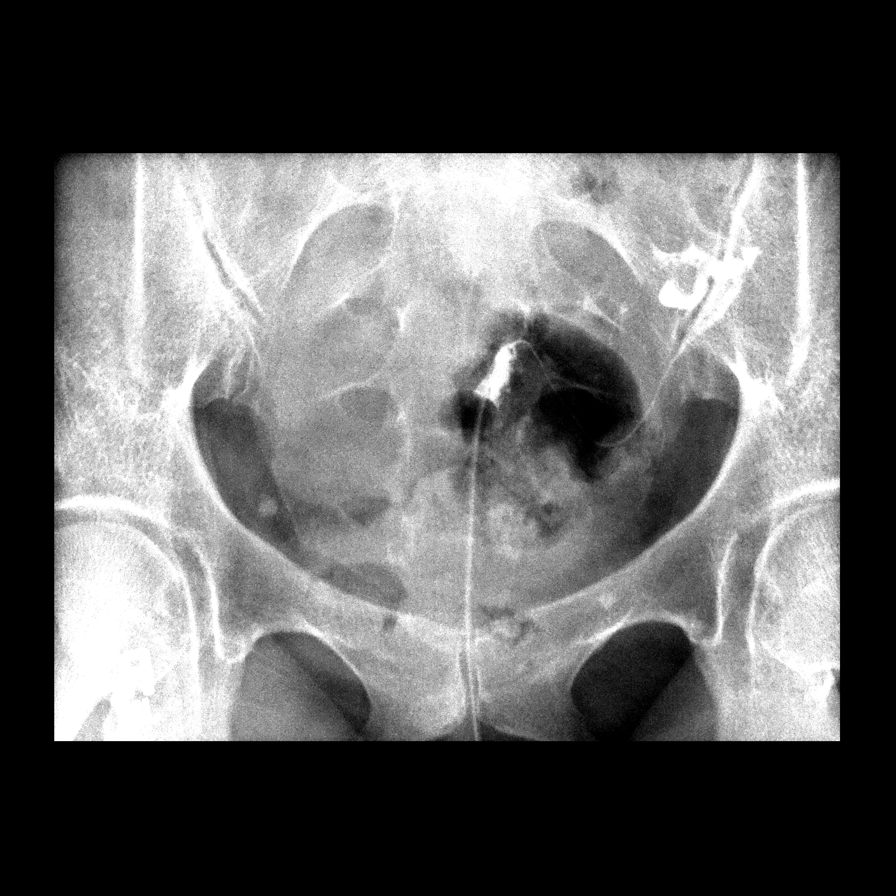
[im 3/5]
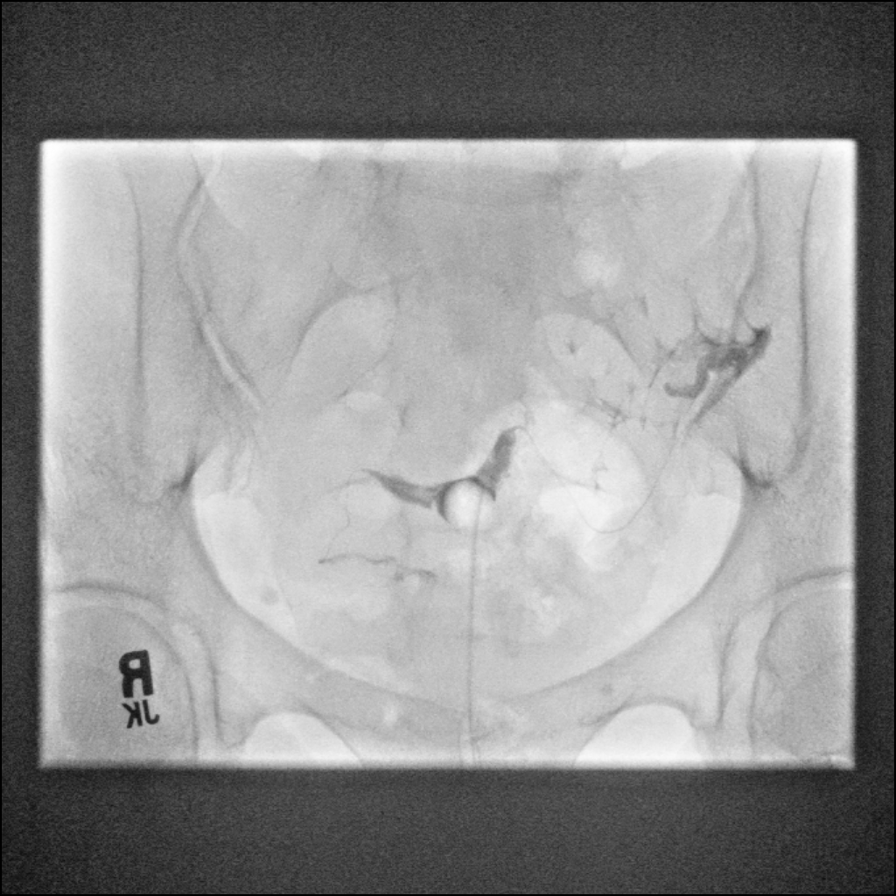
[im 4/5]
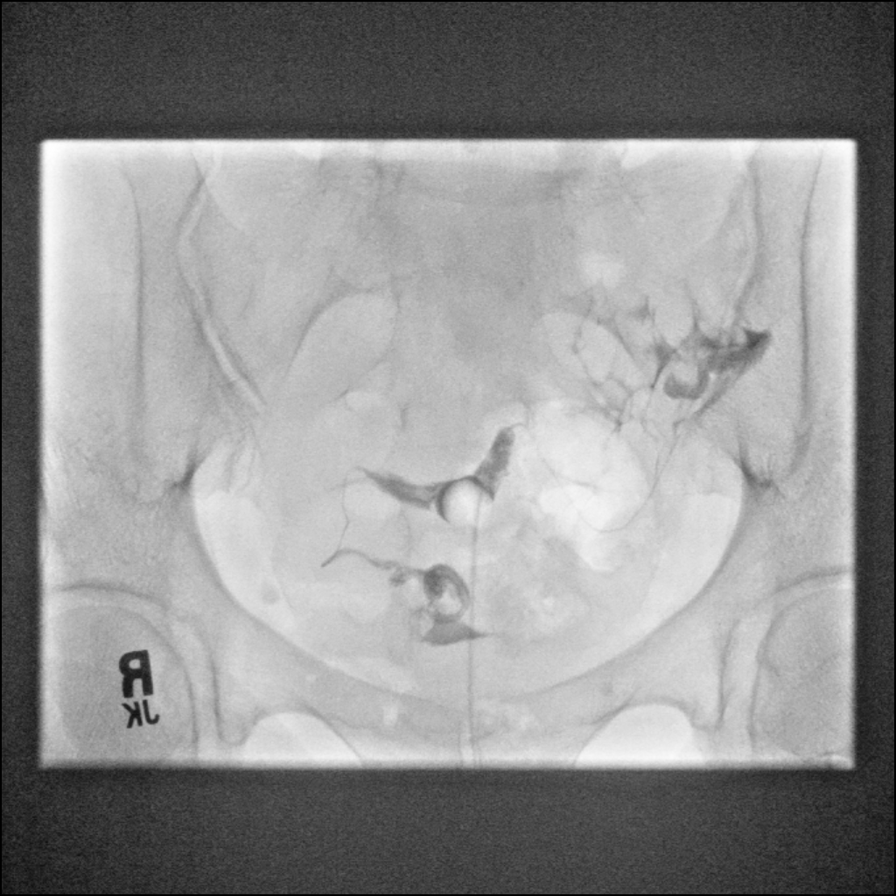
[im 5/5]
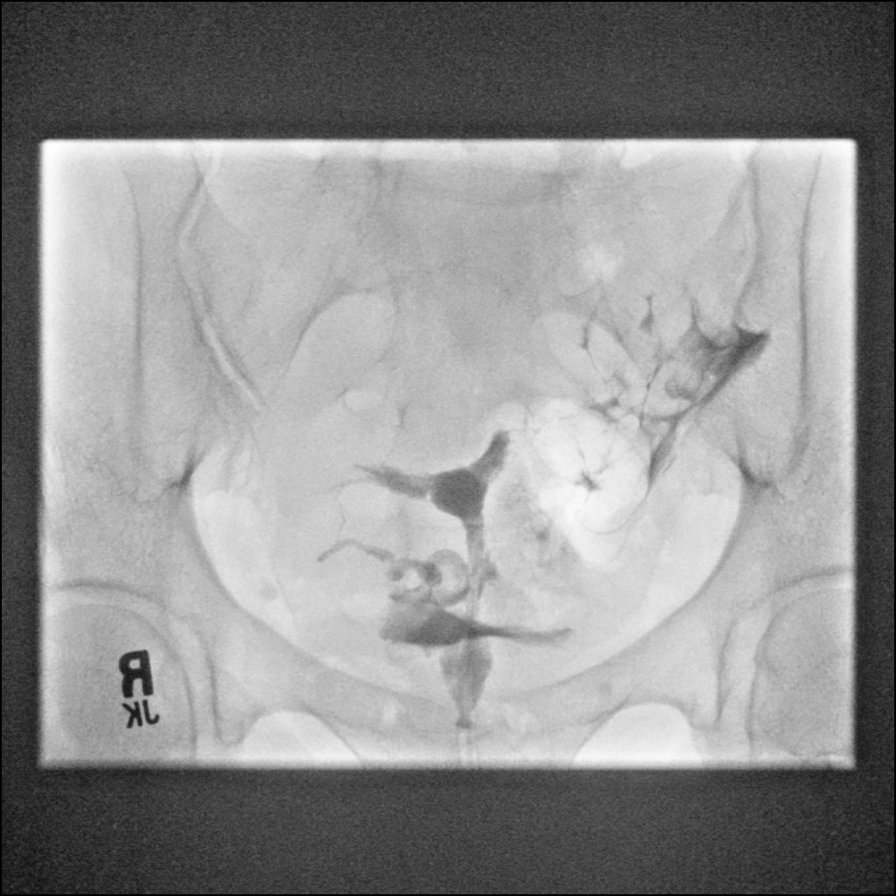

[5 of 5 positions shown; findings below may reference images not displayed]

FLUOROSCOPY TIME:  Radiation Exposure Index (as provided by the
fluoroscopic device): 122 mGy

If the device does not provide the exposure index:

Fluoroscopy Time:  1 minutes 0 seconds

Number of Acquired Images:  0
FINDINGS: Endometrial Cavity: Smooth, concave appearance of fundal contour of
endometrial cavity, consistent with arcuate uterus. No myometrial
septum identified.

Right Fallopian Tube: Normal appearance. Free intraperitoneal spill
of contrast is demonstrated.

Left Fallopian Tube: Normal appearance. Free intraperitoneal spill
of contrast is demonstrated.

Other:  None.
IMPRESSION: 1. Both Fallopian tubes are patent.
2. Arcuate uterus incidentally noted.

## 2021-06-14 ENCOUNTER — Other Ambulatory Visit: Payer: Self-pay | Admitting: Obstetrics and Gynecology

## 2021-06-14 DIAGNOSIS — O30002 Twin pregnancy, unspecified number of placenta and unspecified number of amniotic sacs, second trimester: Secondary | ICD-10-CM

## 2021-06-14 DIAGNOSIS — Z363 Encounter for antenatal screening for malformations: Secondary | ICD-10-CM

## 2021-06-14 DIAGNOSIS — Z3A19 19 weeks gestation of pregnancy: Secondary | ICD-10-CM

## 2021-06-27 ENCOUNTER — Encounter: Payer: Self-pay | Admitting: *Deleted

## 2021-07-01 ENCOUNTER — Other Ambulatory Visit: Payer: Self-pay | Admitting: Obstetrics and Gynecology

## 2021-07-01 ENCOUNTER — Ambulatory Visit: Payer: BC Managed Care – PPO | Attending: Obstetrics and Gynecology

## 2021-07-01 ENCOUNTER — Ambulatory Visit: Payer: BC Managed Care – PPO | Admitting: *Deleted

## 2021-07-01 ENCOUNTER — Ambulatory Visit (HOSPITAL_BASED_OUTPATIENT_CLINIC_OR_DEPARTMENT_OTHER): Payer: BC Managed Care – PPO | Admitting: Obstetrics and Gynecology

## 2021-07-01 ENCOUNTER — Encounter: Payer: Self-pay | Admitting: *Deleted

## 2021-07-01 ENCOUNTER — Other Ambulatory Visit: Payer: Self-pay

## 2021-07-01 VITALS — BP 133/74 | HR 126

## 2021-07-01 DIAGNOSIS — O09812 Supervision of pregnancy resulting from assisted reproductive technology, second trimester: Secondary | ICD-10-CM

## 2021-07-01 DIAGNOSIS — Z363 Encounter for antenatal screening for malformations: Secondary | ICD-10-CM

## 2021-07-01 DIAGNOSIS — O3502X1 Maternal care for (suspected) central nervous system malformation or damage in fetus, anencephaly, fetus 1: Secondary | ICD-10-CM | POA: Insufficient documentation

## 2021-07-01 DIAGNOSIS — Z3A19 19 weeks gestation of pregnancy: Secondary | ICD-10-CM | POA: Diagnosis present

## 2021-07-01 DIAGNOSIS — O30042 Twin pregnancy, dichorionic/diamniotic, second trimester: Secondary | ICD-10-CM | POA: Insufficient documentation

## 2021-07-01 DIAGNOSIS — O30002 Twin pregnancy, unspecified number of placenta and unspecified number of amniotic sacs, second trimester: Secondary | ICD-10-CM | POA: Diagnosis present

## 2021-07-01 DIAGNOSIS — O34219 Maternal care for unspecified type scar from previous cesarean delivery: Secondary | ICD-10-CM

## 2021-07-01 DIAGNOSIS — Q600 Renal agenesis, unilateral: Secondary | ICD-10-CM | POA: Insufficient documentation

## 2021-07-01 DIAGNOSIS — O09522 Supervision of elderly multigravida, second trimester: Secondary | ICD-10-CM

## 2021-07-02 NOTE — Progress Notes (Addendum)
Maternal-Fetal Medicine   Name: Felicia Baxter DOB: 20-Sep-1980 MRN: [id number] Referring Provider: Danne [place], MD  I had the pleasure of seeing [facility] at [facility]. She was accompanied by her husband.   Patient had IVF and embryo trial with a single euploid blastocyst transfer (monozygous).  She had 8-week ultrasound performed at fertility center and I had reached out to Dr. April Manson to determine the chorionicity.  Preimplantation genetic screening showed no increased aneuploidy risks. On serum screening performed at your office MSAFP was increased at 11.73 MoM (OSBR 1 in 10).  Obstetrical history -01/1999: Term cesarean delivery of a female infant weighing 7 pounds and 6 ounces at birth.  Her daughter is in good health. -03/2020: Spontaneous miscarriage at 10 weeks' gestation. GYN history: No history of abnormal Pap smears or cervical surgeries.  No history of breast disease. Past medical history: No history of hypertension or diabetes or any chronic medical conditions. Past surgical history: Cesarean section, laparoscopic cholecystectomy. Medications: Prenatal vitamins. Allergies: Percocet, Darvocet (nausea and vomiting). Social history: Denies tobacco or drug or alcohol use.  She is engaged and her partner is Tree surgeon and he is in good health.  He is not the father of her first child. Family history: No history of venous thromboembolism in the family  Ultrasound We performed fetal anatomical survey.  We performed both transabdominal and transvaginal ultrasound to better evaluate fetal head of twin A.  Twin A: Lower fetus cephalic presentation, posterior previa.  Anencephaly was seen. Amniotic fluid is normal.  Single umbilical artery is seen.  Both kidneys are present.  Fetal biometry excluding head circumference measurement is consistent with the previously established dates.  Twin B: Upper fetus, transverse lie and head to maternal  right, anterior placenta.  Amniotic fluid is normal good fetal activity seen.  Fetal biometry consistent with the previously established dates.  Important findings include: -Mild isolated ventriculomegaly.  Rest of the intracranial structures appear normal.  The CSP is seen well. -Single umbilical artery. -Unilateral renal agenesis (absent right kidney).  We performed transvaginal ultrasound.  The cervix measures 4.7 cm, which is normal.  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  Our concerns include: Twin pregnancy discordant for anencephaly -Placental findings suggest that it is more-likely to be dichorionic-diamniotic twins. However, she had one embryo transfer. An ultrasound was performed at the Fertility center at 8-weeks' gestation but chorionicity was mentioned.  -The couple was counseled that anencephalic fetuses have 100% mortality after birth, and in some cases, in utero death can occur.  -Twin pregnancy with anencephaly is associated with pregnancy complications. -In a report of 64 dichorionic-diamniotic twin pregnancies discordant for anencephaly, 30 were managed expectantly. Of these, 57% had polyhydramnios and a quarter of them had amniodrainage and 10% had feticide of anencephalic fetus. Overall, 34 of 35 non-anencephalic fetuses were liveborn with a median gestational age at 59 weeks.  -Feticide of anencephalic fetuses was performed in 9 cases and 89% of non-anencephalic fetuses had good outcomes.  -Expectant management of dichorionic-diamniotic twins discordant for anencephaly is reasonable with close follow-up for and management of polyhydramnios with drainage.   In monochorionic-diamniotic twins discordant for anencephaly, livebirth outcome was 84% at a median gestational age of [redacted] weeks. Death of anencephalic fetuses were seen in 4 weeks and 3 of the 4 co-twins (normal) died.  After counseling, the couple wanted time to think over the findings. She informed she would like to  proceed with amniocentesis of twin B when  she returns in 2 days.  We made an appointment for them to return on Wednesday (07/03/21) to meet with our genetic counselor and for amniocentesis.  Reference: Dilemmas in the management of twins discordant for anencephaly diagnosed at 11+0 to 13+6 weeks of gestation. Vendecruys et al. Ultrasound Obstet Gynecol 2006;28:653-658.  Abnormal ultrasound findings in twin B -Single umbilical artery (SUA) SUA is seen in about 1% of fetuses. In the absence of other anomalies, the risk for aneuploidies is not increased. SUA can be associated with fetal growth restriction, and we recommend serial growth scans for fetal growth assessment. The association of SUA with cardiac anomalies is not conclusively proven.    -Ventriculomegaly I counseled the patient that ventriculomegaly can be associated with fetal chromosomal malformations, genetic syndromes, fetal infection, and normal fetus.   I reassured the patient that in most cases, isolated borderline ventriculomegaly is not associated with adverse neurodevelopmental outcomes. It is also likely that ventriculomegaly may resolve with advancing gestation. If progression is seen, MRI may be advised to rule out structural anomalies.   -Unilateral renal agenesis The incidence of unilateral renal agenesis is roughly 1 in 1,000 births.  In the absence of associated anomalies, chromosomal anomalies are rare. However, it can be associated with some chromosomal anomalies (trisomy 21, 10, 7, 22q 11.2 deletion) and genetic syndromes.    I discussed the abnormal findings in twin B and recommended amniocentesis for fetal karyotype and microarray to rule out chromosomal anomalies or genetic syndromes.  We made an appointment for her to return on 07/03/21 to meet with our genetic counselor and for possible amniocentesis.  Recommendations -An appointment was made for the patient to return on 07/03/21 for genetic counseling and  amniocentesis. -Fetal karyotype, microarray and infectious work up (CMV and Toxo PCR) on amniocentesis. -Waiting for Dr. April Manson to confirm chorionicity established at 8-week ultrasound performed at his office.  Thank you for consultation.  If you have any questions or concerns, please contact me the [facility].  Consultation including face-to-face (more than 50%) counseling 60 minutes. Xxxxxxxxxxxxxxxxxxxxxxxxxxxxxxxxxxxxxx Addendum (07/02/21): Received a call from Dr. April Manson who confirmed that it is a monochorionic-diamniotic placentation as seen at 8-week ultrasound. I will counsel the patient again tomorrow.

## 2021-07-03 ENCOUNTER — Ambulatory Visit (HOSPITAL_BASED_OUTPATIENT_CLINIC_OR_DEPARTMENT_OTHER): Payer: BC Managed Care – PPO

## 2021-07-03 ENCOUNTER — Ambulatory Visit: Payer: BC Managed Care – PPO

## 2021-07-03 ENCOUNTER — Ambulatory Visit: Payer: BC Managed Care – PPO | Attending: Obstetrics and Gynecology

## 2021-07-03 ENCOUNTER — Other Ambulatory Visit: Payer: Self-pay

## 2021-07-03 ENCOUNTER — Other Ambulatory Visit: Payer: Self-pay | Admitting: Obstetrics and Gynecology

## 2021-07-03 ENCOUNTER — Ambulatory Visit (HOSPITAL_BASED_OUTPATIENT_CLINIC_OR_DEPARTMENT_OTHER): Payer: BC Managed Care – PPO | Admitting: Obstetrics and Gynecology

## 2021-07-03 DIAGNOSIS — Z3A19 19 weeks gestation of pregnancy: Secondary | ICD-10-CM

## 2021-07-03 DIAGNOSIS — O358XX2 Maternal care for other (suspected) fetal abnormality and damage, fetus 2: Secondary | ICD-10-CM

## 2021-07-03 DIAGNOSIS — O4402 Placenta previa specified as without hemorrhage, second trimester: Secondary | ICD-10-CM

## 2021-07-03 DIAGNOSIS — O09812 Supervision of pregnancy resulting from assisted reproductive technology, second trimester: Secondary | ICD-10-CM

## 2021-07-03 DIAGNOSIS — O30032 Twin pregnancy, monochorionic/diamniotic, second trimester: Secondary | ICD-10-CM | POA: Diagnosis not present

## 2021-07-03 DIAGNOSIS — O283 Abnormal ultrasonic finding on antenatal screening of mother: Secondary | ICD-10-CM | POA: Diagnosis not present

## 2021-07-03 DIAGNOSIS — O3502X1 Maternal care for (suspected) central nervous system malformation or damage in fetus, anencephaly, fetus 1: Secondary | ICD-10-CM

## 2021-07-03 DIAGNOSIS — O34219 Maternal care for unspecified type scar from previous cesarean delivery: Secondary | ICD-10-CM | POA: Diagnosis not present

## 2021-07-03 DIAGNOSIS — O09522 Supervision of elderly multigravida, second trimester: Secondary | ICD-10-CM | POA: Insufficient documentation

## 2021-07-03 DIAGNOSIS — Z362 Encounter for other antenatal screening follow-up: Secondary | ICD-10-CM | POA: Diagnosis present

## 2021-07-03 DIAGNOSIS — O3502X0 Maternal care for (suspected) central nervous system malformation or damage in fetus, anencephaly, not applicable or unspecified: Secondary | ICD-10-CM | POA: Insufficient documentation

## 2021-07-03 NOTE — Addendum Note (Signed)
Addended by: Teena Dunk on: 07/03/2021 03:06 PM   Modules accepted: Orders

## 2021-07-03 NOTE — Addendum Note (Signed)
Addended by: Teena Dunk on: 07/03/2021 02:07 PM   Modules accepted: Orders

## 2021-07-03 NOTE — Addendum Note (Signed)
Addended by: Teena Dunk on: 07/03/2021 03:08 PM   Modules accepted: Orders

## 2021-07-03 NOTE — Progress Notes (Signed)
Maternal-Fetal Medicine   Name: Estera Ozier DOB: 15-Oct-1980 MRN: [id number] Referring Provider: Servando Salina, MD   I had the pleasure of seeing Ms. Cousins today at the [facility].  She was accompanied by her husband.  The couple met with our genetic counselor. After her last visit, I spoke with Dr. Kerin Perna and learned that at 8-week ultrasound, monochorionic-diamniotic twin pregnancy placentation was seen.  He had since faxed an amended report.  Monochorionic-diamniotic twin pregnancy discordant for anencephaly I counseled the couple on monochorionic-diamniotic twins discordant for anencephaly.  In the study of 19 patients with monochorionic twins that were discordant for anencephaly the outcomes were reported.  All were managed expectantly.  Of these patients, 10 developed polyhydramnios and 3 had amnio drainage.  In 4 pregnancies the anencephalic fetuses died and of these 3 cotwin normal fetuses also died.  In total 16 out of 19 cases where liveborn (84%) at the median gestational age of [redacted] weeks (range 27 to 39 weeks).  I have reassured her that pregnancy outcomes is reasonably good if there is no death of anencephalic fetus.  I explained the procedure of amnio drainage that may be performed frequently with an intention to prolong the pregnancy.  Severe polyhydramnios can lead to preterm labor and delivery.  I discussed abnormal ultrasound findings in twin B and recommended amniocentesis for fetal karyotype and Microarray analysis.  I explained amniocentesis procedure and possible complication of miscarriage (1 and 500 procedures).  I informed the patient that miscarriage rates are higher in anomalous fetuses and they are independent of amniocentesis procedures performed.  After counseling, the patient opted to have amniocentesis.  Ultrasound Twin A: Anencephaly. Amniotic fluid is normal and good fetal activity is seen. Placenta previa is seen again. Twin B:  Upper fetus, breech, anterior placenta. Amniotic fluid is normal and good fetal activity is seen. Mild isolated ventriculomegaly is seen again (10 mm).   Amniocentesis (Twin B only) After informed consent, amniocentesis was performed by Dr. Donalee Citrin under ultrasound guidance and 40 milliliters of clear amniotic fluid was withdrawn. Fluid was sent for AFAFP, fetal karyotype and microarray analysis. Patient tolerated the procedure well. Post-procedure fetal heart rate was normal.  We gave her postprocedure instructions. Patient went to the lab to have blood drawn for maternal cell contamination studies.  Recommendations: -We will communicate the results to the patient and fax copies to your office. -Appointment was made for her to return in 2 weeks for a limited ultrasound evaluation and then in 4 weeks for fetal growth assessment. -Ultrasound every 2 weeks or more frequently if polyhydramnios develops.  Thank you for consultation.  If you have any questions or concerns, please contact me the [facility].  Consultation including face-to-face (more than 50%) counseling 30 minutes.

## 2021-07-04 ENCOUNTER — Other Ambulatory Visit: Payer: Self-pay | Admitting: *Deleted

## 2021-07-04 DIAGNOSIS — O30039 Twin pregnancy, monochorionic/diamniotic, unspecified trimester: Secondary | ICD-10-CM

## 2021-07-04 NOTE — Progress Notes (Signed)
Name: Felicia Baxter Indication: Multiple anomalies visualized on ultrasound  DOB: Dec 03, 1980 Age: 40 y.o.   EDC: 11/23/2021 Embryo Transfer: 03/05/2021 Referring Provider: Maxie Better, MD  EGA: [redacted]w[redacted]d Genetic Counselor: Teena Dunk, MS, CGC  OB Hx: [id number] Date of Appointment: 07/03/2021  Accompanied by: Father of the current pregnancy, Felicia Baxter Face to Face Time: 40 Minutes   Medical History:  This is Felicia Baxter's 3rd pregnancy. She has one living child. She has had one spontaneous loss.  Reports she takes folic acid, prenatal vitamins, Cymbalta, baby aspirin, and phenergan. Denies personal history of diabetes, high blood pressure, thyroid conditions, and seizures. Denies bleeding, infections, and fevers in this pregnancy. Denies using tobacco, alcohol, or street drugs in this pregnancy.   Family History:  Maternal ethnicity reported as Native American and paternal ethnicity reported as Leisure centre manager. Denies Ashkenazi Jewish ancestry. Patient denies consanguinity, individuals in the family with birth defects, intellectual disability, autism spectrum disorder, mental illness, multiple spontaneous abortions, still births, or unexplained neonatal death.     Genetic Counseling:  Twin A: Anencephaly. Anencephaly was visualized for Twin A on Felicia Baxter's ultrasound. Anencephaly is a severe type of open neural tube defect in which the "cephalic" or head-end of the neural tube fails to close, resulting in the absence of major portions of brain, skull, and scalp. Infants with this disorder are born without a forebrain (the front part of the brain) and a cerebrum (area of the brain responsible for thinking, vision, hearing, touch, and movement). The remaining brain tissue is often exposed. Anencephaly is not compatible with life, and most affected pregnancies will result in a stillbirth. Liveborn infants do not survive more than a few hours or days. Not all causes of anencephaly are  well understood. Anencephaly can be a multifactorial condition (caused by a combination of multiple genes and the environment). It can also be a feature of some chromosomal disorders, however, Felicia Baxter reports the embryo used for this pregnancy was reported to be euploid.  Twin B: Two-Vessel Cord. Typically, an umbilical cord has two arteries and one vein. However, some umbilical cords have just one artery and one vein. This condition is known as a two-vessel cord or single umbilical artery. Genetic counseling reviewed with Felicia Baxter that this could be an isolated finding, or it could be associated with aneuploidy or other congenital anomalies.  Unilateral Renal Agenesis. Renal agenesis is bilateral or unilateral absence of the kidneys. Unilateral renal agenesis occurs in 1 in every 1,000 births and bilateral renal agenesis occurs in 1 in every 4,000 births. In bilateral disease, the clinical findings include pulmonary hypoplasia, oligohydramnios, limb contractures, and characteristic facies (Potter sequence). Renal agenesis is associated with other genitourinary (50%), gastrointestinal, and cardiac abnormalities and is part of more than 50 multiple-malformation syndromes. Generally, the inheritance of renal agenesis is sporadic. Some cases may be familial and are inherited in an autosomal dominant manner with incomplete penetrance.  Ventriculomegaly. Ventriculomegaly is a condition in which the ventricles in the brain appear larger than normal on prenatal ultrasound. The increase in ventricular size can be due to obstruction, loss of cerebral tissue, or underdevelopment of the cerebral hemispheres. Therefore, the etiology is heterogeneous and includes chromosomal abnormalities, genetic syndromes, hemorrhage, and infections.   Genetic counseling reviewed the above findings for Twin A and Twin B with the couple. Given the findings of a two-vessel cord, unilateral renal agenesis, and ventriculomegaly for Twin B,  genetic counseling offered Felicia Baxter amniocentesis for prenatal diagnosis. Felicia Baxter accepted this testing and an amniocentesis was  performed for Twin B by Dr. Judeth Cornfield in our office today.   Testing/Screening Options:   Amniocentesis. This procedure is available for prenatal diagnosis. Possible procedural difficulties and complications that can arise include maternal infection, cramping, bleeding, fluid leakage, and/or pregnancy loss. The risk for pregnancy loss with an amniocentesis is 1/500. Per the [employer] (ACOG) Practice Bulletin 162, all pregnant women should be offered prenatal assessment for aneuploidy by diagnostic testing regardless of maternal age or other risk factors. If indicated, genetic testing that could be ordered on an amniocentesis sample includes a fetal karyotype, fetal microarray, and testing for specific syndromes.    Patient Plan:  Proceed with: Amniocentesis for prenatal diagnosis for Twin B. Testing which was ordered on the amniotic fluid includes a fetal karyotype with reflex to microarray studies including maternal cell contamination, AF-AFP studies, AF Toxoplasmosis and AF CMV studies. Informed consent was obtained. All questions were answered.   Thank you for sharing in the care of Felicia Baxter with [place].  Please do not hesitate to contact us if you have any questions.  Teena Dunk, MS, [facility]

## 2021-07-08 ENCOUNTER — Telehealth: Payer: Self-pay | Admitting: Genetics

## 2021-07-08 NOTE — Telephone Encounter (Signed)
Called [facility] and discussed negative amniotic fluid CMV and Toxo results. Discussed that AF-AFP will most likely be the next result to be returned followed by the karyotype and then the microarray if the karyotype is normal. All questions answered.

## 2021-07-12 ENCOUNTER — Telehealth: Payer: Self-pay | Admitting: Genetics

## 2021-07-12 NOTE — Telephone Encounter (Signed)
Called Felicia Baxter to return AF-AFP results for twin B. Left voicemail with genetic counseling call back number.

## 2021-07-17 ENCOUNTER — Ambulatory Visit: Payer: BC Managed Care – PPO

## 2021-07-18 ENCOUNTER — Ambulatory Visit: Payer: BC Managed Care – PPO | Admitting: *Deleted

## 2021-07-18 ENCOUNTER — Other Ambulatory Visit: Payer: Self-pay

## 2021-07-18 ENCOUNTER — Encounter: Payer: Self-pay | Admitting: *Deleted

## 2021-07-18 ENCOUNTER — Ambulatory Visit: Payer: BC Managed Care – PPO | Attending: Obstetrics and Gynecology

## 2021-07-18 VITALS — BP 117/75 | HR 127

## 2021-07-18 DIAGNOSIS — O30032 Twin pregnancy, monochorionic/diamniotic, second trimester: Secondary | ICD-10-CM

## 2021-07-18 DIAGNOSIS — Z3A21 21 weeks gestation of pregnancy: Secondary | ICD-10-CM

## 2021-07-18 DIAGNOSIS — O30039 Twin pregnancy, monochorionic/diamniotic, unspecified trimester: Secondary | ICD-10-CM | POA: Diagnosis present

## 2021-07-18 DIAGNOSIS — O35EXX2 Maternal care for other (suspected) fetal abnormality and damage, fetal genitourinary anomalies, fetus 2: Secondary | ICD-10-CM

## 2021-07-18 DIAGNOSIS — O3502X1 Maternal care for (suspected) central nervous system malformation or damage in fetus, anencephaly, fetus 1: Secondary | ICD-10-CM | POA: Diagnosis not present

## 2021-07-18 DIAGNOSIS — O4402 Placenta previa specified as without hemorrhage, second trimester: Secondary | ICD-10-CM

## 2021-07-18 DIAGNOSIS — O09522 Supervision of elderly multigravida, second trimester: Secondary | ICD-10-CM

## 2021-07-18 DIAGNOSIS — O09812 Supervision of pregnancy resulting from assisted reproductive technology, second trimester: Secondary | ICD-10-CM

## 2021-07-18 LAB — AFP, AMNIOTIC FLUID
AFP, Amniotic Fluid (mcg/ml): 108.6 ug/mL
Gestational Age(Wks): 19
MOM, Amniotic Fluid: 13.69

## 2021-07-18 LAB — SPECIMEN STATUS REPORT

## 2021-07-18 LAB — ACHE+HB F

## 2021-07-24 LAB — MCC TRACKING

## 2021-07-31 ENCOUNTER — Ambulatory Visit: Payer: BC Managed Care – PPO

## 2021-08-01 LAB — TOXOPLASMA GONDII, AMNIOTIC FLUID, PCR: Toxoplasma Gondii PCR Amniotic: NEGATIVE

## 2021-08-01 LAB — CHROMOSOME MICROARRAY REFLEX, AMN FLD

## 2021-08-01 LAB — CHROMOSOME ANALYSIS W REFLEX TO SNP, AMNIOTIC
Cells Analyzed: 15
Cells Counted: 15
Cells Karyotyped: 2
Colonies: 15
GTG Band Resolution Achieved: 400

## 2021-08-01 LAB — CYTOMEGALOVIRUS (CMV), AMNIOTIC FLUID, PCR: CMV PCR, Amniotic: NEGATIVE

## 2021-08-07 ENCOUNTER — Telehealth: Payer: Self-pay | Admitting: Genetics

## 2021-08-07 LAB — MATERNAL CELL CONTAMINATION

## 2021-08-07 NOTE — Telephone Encounter (Signed)
Returned normal microarray result for Twin B to [facility] [employer]. All questions answered. No further genetic testing is pending.

## 2021-08-08 ENCOUNTER — Encounter: Payer: Self-pay | Admitting: *Deleted

## 2021-08-08 ENCOUNTER — Ambulatory Visit: Payer: BC Managed Care – PPO | Attending: Obstetrics and Gynecology

## 2021-08-08 ENCOUNTER — Other Ambulatory Visit: Payer: Self-pay

## 2021-08-08 ENCOUNTER — Ambulatory Visit: Payer: BC Managed Care – PPO | Admitting: *Deleted

## 2021-08-08 ENCOUNTER — Other Ambulatory Visit: Payer: Self-pay | Admitting: *Deleted

## 2021-08-08 VITALS — BP 122/73 | HR 116

## 2021-08-08 DIAGNOSIS — O30032 Twin pregnancy, monochorionic/diamniotic, second trimester: Secondary | ICD-10-CM | POA: Diagnosis not present

## 2021-08-08 DIAGNOSIS — O34219 Maternal care for unspecified type scar from previous cesarean delivery: Secondary | ICD-10-CM | POA: Diagnosis not present

## 2021-08-08 DIAGNOSIS — O30039 Twin pregnancy, monochorionic/diamniotic, unspecified trimester: Secondary | ICD-10-CM | POA: Diagnosis not present

## 2021-08-08 DIAGNOSIS — O30042 Twin pregnancy, dichorionic/diamniotic, second trimester: Secondary | ICD-10-CM | POA: Diagnosis present

## 2021-08-08 DIAGNOSIS — Z3A24 24 weeks gestation of pregnancy: Secondary | ICD-10-CM | POA: Diagnosis not present

## 2021-08-08 DIAGNOSIS — O09522 Supervision of elderly multigravida, second trimester: Secondary | ICD-10-CM | POA: Diagnosis not present

## 2021-08-08 NOTE — Progress Notes (Unsigned)
u

## 2021-08-14 ENCOUNTER — Ambulatory Visit: Payer: BC Managed Care – PPO

## 2021-08-22 ENCOUNTER — Ambulatory Visit: Payer: BC Managed Care – PPO | Admitting: *Deleted

## 2021-08-22 ENCOUNTER — Ambulatory Visit: Payer: BC Managed Care – PPO | Attending: Obstetrics and Gynecology

## 2021-08-22 ENCOUNTER — Other Ambulatory Visit: Payer: Self-pay

## 2021-08-22 VITALS — BP 125/83 | HR 106

## 2021-08-22 DIAGNOSIS — O09522 Supervision of elderly multigravida, second trimester: Secondary | ICD-10-CM | POA: Diagnosis present

## 2021-08-22 DIAGNOSIS — O3502X1 Maternal care for (suspected) central nervous system malformation or damage in fetus, anencephaly, fetus 1: Secondary | ICD-10-CM | POA: Diagnosis not present

## 2021-08-22 DIAGNOSIS — O30039 Twin pregnancy, monochorionic/diamniotic, unspecified trimester: Secondary | ICD-10-CM | POA: Diagnosis not present

## 2021-08-22 DIAGNOSIS — O35EXX2 Maternal care for other (suspected) fetal abnormality and damage, fetal genitourinary anomalies, fetus 2: Secondary | ICD-10-CM

## 2021-08-22 DIAGNOSIS — O4402 Placenta previa specified as without hemorrhage, second trimester: Secondary | ICD-10-CM

## 2021-08-22 DIAGNOSIS — Z3A26 26 weeks gestation of pregnancy: Secondary | ICD-10-CM

## 2021-08-22 DIAGNOSIS — O30032 Twin pregnancy, monochorionic/diamniotic, second trimester: Secondary | ICD-10-CM

## 2021-09-05 ENCOUNTER — Ambulatory Visit: Payer: BC Managed Care – PPO | Admitting: *Deleted

## 2021-09-05 ENCOUNTER — Other Ambulatory Visit: Payer: Self-pay

## 2021-09-05 ENCOUNTER — Ambulatory Visit: Payer: BC Managed Care – PPO | Attending: Obstetrics

## 2021-09-05 VITALS — BP 131/85 | HR 113

## 2021-09-05 DIAGNOSIS — O3502X2 Maternal care for (suspected) central nervous system malformation or damage in fetus, anencephaly, fetus 2: Secondary | ICD-10-CM | POA: Diagnosis not present

## 2021-09-05 DIAGNOSIS — Z3A28 28 weeks gestation of pregnancy: Secondary | ICD-10-CM

## 2021-09-05 DIAGNOSIS — O30039 Twin pregnancy, monochorionic/diamniotic, unspecified trimester: Secondary | ICD-10-CM | POA: Diagnosis present

## 2021-09-05 DIAGNOSIS — O4403 Placenta previa specified as without hemorrhage, third trimester: Secondary | ICD-10-CM | POA: Diagnosis not present

## 2021-09-05 DIAGNOSIS — O09523 Supervision of elderly multigravida, third trimester: Secondary | ICD-10-CM | POA: Diagnosis not present

## 2021-09-05 DIAGNOSIS — O30033 Twin pregnancy, monochorionic/diamniotic, third trimester: Secondary | ICD-10-CM | POA: Diagnosis not present

## 2021-09-06 ENCOUNTER — Other Ambulatory Visit: Payer: Self-pay | Admitting: *Deleted

## 2021-09-06 DIAGNOSIS — O3502X1 Maternal care for (suspected) central nervous system malformation or damage in fetus, anencephaly, fetus 1: Secondary | ICD-10-CM

## 2021-09-06 DIAGNOSIS — O30039 Twin pregnancy, monochorionic/diamniotic, unspecified trimester: Secondary | ICD-10-CM

## 2021-09-06 DIAGNOSIS — O09523 Supervision of elderly multigravida, third trimester: Secondary | ICD-10-CM

## 2021-09-06 DIAGNOSIS — O35EXX0 Maternal care for other (suspected) fetal abnormality and damage, fetal genitourinary anomalies, not applicable or unspecified: Secondary | ICD-10-CM

## 2021-09-06 DIAGNOSIS — O4403 Placenta previa specified as without hemorrhage, third trimester: Secondary | ICD-10-CM

## 2021-09-06 DIAGNOSIS — R772 Abnormality of alphafetoprotein: Secondary | ICD-10-CM

## 2021-09-16 ENCOUNTER — Inpatient Hospital Stay (HOSPITAL_BASED_OUTPATIENT_CLINIC_OR_DEPARTMENT_OTHER): Payer: BC Managed Care – PPO

## 2021-09-16 ENCOUNTER — Inpatient Hospital Stay (HOSPITAL_COMMUNITY)
Admission: AD | Admit: 2021-09-16 | Discharge: 2021-09-16 | Disposition: A | Discharge status: Home / Self Care | Payer: BC Managed Care – PPO | Attending: Obstetrics and Gynecology | Admitting: Obstetrics and Gynecology

## 2021-09-16 ENCOUNTER — Encounter (HOSPITAL_COMMUNITY): Payer: Self-pay | Admitting: Obstetrics and Gynecology

## 2021-09-16 DIAGNOSIS — O322XX2 Maternal care for transverse and oblique lie, fetus 2: Secondary | ICD-10-CM | POA: Diagnosis not present

## 2021-09-16 DIAGNOSIS — O09813 Supervision of pregnancy resulting from assisted reproductive technology, third trimester: Secondary | ICD-10-CM | POA: Insufficient documentation

## 2021-09-16 DIAGNOSIS — O4403 Placenta previa specified as without hemorrhage, third trimester: Secondary | ICD-10-CM | POA: Diagnosis not present

## 2021-09-16 DIAGNOSIS — Z3A30 30 weeks gestation of pregnancy: Secondary | ICD-10-CM | POA: Insufficient documentation

## 2021-09-16 DIAGNOSIS — O30033 Twin pregnancy, monochorionic/diamniotic, third trimester: Secondary | ICD-10-CM | POA: Insufficient documentation

## 2021-09-16 DIAGNOSIS — O09523 Supervision of elderly multigravida, third trimester: Secondary | ICD-10-CM

## 2021-09-16 DIAGNOSIS — O35EXX2 Maternal care for other (suspected) fetal abnormality and damage, fetal genitourinary anomalies, fetus 2: Secondary | ICD-10-CM | POA: Diagnosis not present

## 2021-09-16 DIAGNOSIS — O1403 Mild to moderate pre-eclampsia, third trimester: Secondary | ICD-10-CM

## 2021-09-16 DIAGNOSIS — Z98891 History of uterine scar from previous surgery: Secondary | ICD-10-CM | POA: Diagnosis not present

## 2021-09-16 DIAGNOSIS — O139 Gestational [pregnancy-induced] hypertension without significant proteinuria, unspecified trimester: Secondary | ICD-10-CM | POA: Diagnosis not present

## 2021-09-16 LAB — CBC
HCT: 33.7 % — ABNORMAL LOW (ref 36.0–46.0)
Hemoglobin: 11 g/dL — ABNORMAL LOW (ref 12.0–15.0)
MCH: 27.8 pg (ref 26.0–34.0)
MCHC: 32.6 g/dL (ref 30.0–36.0)
MCV: 85.3 fL (ref 80.0–100.0)
Platelets: 185 10*3/uL (ref 150–400)
RBC: 3.95 MIL/uL (ref 3.87–5.11)
RDW: 12.8 % (ref 11.5–15.5)
WBC: 8.8 10*3/uL (ref 4.0–10.5)
nRBC: 0 % (ref 0.0–0.2)

## 2021-09-16 LAB — URINALYSIS, ROUTINE W REFLEX MICROSCOPIC
Bilirubin Urine: NEGATIVE
Glucose, UA: NEGATIVE mg/dL
Ketones, ur: NEGATIVE mg/dL
Nitrite: NEGATIVE
Protein, ur: NEGATIVE mg/dL
Specific Gravity, Urine: 1.02 (ref 1.005–1.030)
pH: 6 (ref 5.0–8.0)

## 2021-09-16 LAB — COMPREHENSIVE METABOLIC PANEL
ALT: 10 U/L (ref 0–44)
AST: 20 U/L (ref 15–41)
Albumin: 2.3 g/dL — ABNORMAL LOW (ref 3.5–5.0)
Alkaline Phosphatase: 157 U/L — ABNORMAL HIGH (ref 38–126)
Anion gap: 6 (ref 5–15)
BUN: 5 mg/dL — ABNORMAL LOW (ref 6–20)
CO2: 25 mmol/L (ref 22–32)
Calcium: 9.1 mg/dL (ref 8.9–10.3)
Chloride: 103 mmol/L (ref 98–111)
Creatinine, Ser: 0.82 mg/dL (ref 0.44–1.00)
GFR, Estimated: >60 mL/min (ref >60)
Glucose, Bld: 85 mg/dL (ref 70–99)
Potassium: 3.7 mmol/L (ref 3.5–5.1)
Sodium: 134 mmol/L — ABNORMAL LOW (ref 135–145)
Total Bilirubin: 0.5 mg/dL (ref 0.3–1.2)
Total Protein: 5.4 g/dL — ABNORMAL LOW (ref 6.5–8.1)

## 2021-09-16 LAB — URINALYSIS, MICROSCOPIC (REFLEX): WBC, UA: >50 WBC/hpf (ref 0–5)

## 2021-09-16 LAB — PROTEIN / CREATININE RATIO, URINE
Creatinine, Urine: 98.65 mg/dL
Protein Creatinine Ratio: 0.38 mg/mg{Cre} — ABNORMAL HIGH (ref 0.00–0.15)
Total Protein, Urine: 37 mg/dL

## 2021-09-16 NOTE — MAU Provider Note (Signed)
History     Chief Complaint  Patient presents with   Hypertension   Abdominal Pain    41 yo G3P1011 MF with mono-di twin gestation @ 30 2/7 wk sent for evaluation after complaining of facial ,  Hand and leg swelling. Pt was dx with mild preeclampsia several days ago after she presented to office For evaluation of c/o severe heartburn not responsive to pepcid and protonix. Pt denies h/a, visual changes. Pregnancy  Which is IVF is complicated by twin A with anencephaly and twin B with bilateral ventriculomegaly, SUA and Solitary kidney. Previous Cesarean section and currently with placenta previa OB History     Gravida  3   Para  1   Term  1   Preterm  0   AB  1   Living  1      SAB  1   IAB  0   Ectopic  0   Multiple  0   Live Births  1           Past Medical History:  Diagnosis Date   Abnormal Pap smear of cervix ~2002 & ~ 2006   COLPO biopsy with first abnormal   Anxiety    Depression    History of IBS     Past Surgical History:  Procedure Laterality Date   AUGMENTATION MAMMAPLASTY     implants   BREAST SURGERY     CESAREAN SECTION  01/1999   CYST EXCISION  2006   seb. cyst upper back    CYST EXCISION Left 2010   left buttock   INTRAUTERINE DEVICE INSERTION  08/22/13   Mirena, first Mirena inserted 08/2008   LAPAROSCOPIC CHOLECYSTECTOMY     REFRACTIVE SURGERY      Family History  Problem Relation Age of Onset   Cancer Father 80       Prostate    Hypertension Maternal Grandmother    Cancer Maternal Grandfather     Social History   Tobacco Use   Smoking status: Never   Smokeless tobacco: Never  Vaping Use   Vaping Use: Never used  Substance Use Topics   Alcohol use: Not Currently    Comment: not while preg   Drug use: No    Allergies:  Allergies  Allergen Reactions   Percocet [Oxycodone-Acetaminophen] Nausea And Vomiting   Amitiza [Lubiprostone]     Medications Prior to Admission  Medication Sig Dispense Refill Last Dose    aspirin EC 81 MG tablet Take 81 mg by mouth daily. Swallow whole.   09/15/2021   DULoxetine (CYMBALTA) 60 MG capsule Take 60 mg by mouth daily.   09/15/2021   famotidine (PEPCID) 20 MG tablet Take 20 mg by mouth 2 (two) times daily.   [id number]   FOLIC ACID PO Take by mouth.   09/15/2021   pantoprazole (PROTONIX) 40 MG tablet Take 40 mg by mouth daily.   09/15/2021   Prenatal Vit-Fe Fumarate-FA (PRENATAL MULTIVITAMIN) TABS tablet Take 1 tablet by mouth daily at 12 noon.   09/15/2021     Physical Exam   Blood pressure 122/80, pulse (!) 119, temperature 98.6 F (37 C), temperature source Oral, resp. rate 20, height 5\' 5"  (1.651 m), weight 73.8 kg, last menstrual period 01/06/2021, SpO2 97 %. Patient Vitals for the past 24 hrs:  BP Temp Temp src Pulse Resp SpO2 Height Weight  09/16/21 1201 122/80 -- -- (!) 119 -- 97 % -- --  09/16/21 1149 125/88 -- -- (!) 112 -- -- -- --  09/16/21 1132 112/79 -- -- (!) 131 -- -- -- --  09/16/21 1116 122/75 -- -- (!) 130 -- -- -- --  09/16/21 1044 133/72 98.6 F (37 C) Oral (!) 120 20 99 % 5\' 5"  (1.651 m) 73.8 kg   HEENT; no facial edema noted Lungs: clear to auscultation bilaterally Heart: tachycardia Abdomen:  gravid pfannenstiel skin incision Extremities: no edema, redness or tenderness in the calves or thighs Skin: Skin color, texture, turgor normal. No rashes or lesions NST x 2 reviewed  Twin A( anencephalic). Baseline 140 (+) accel 150 Twin B( bilateral ventriculomegaly) baseline 140 no accel ED Course  IMP: mild preeclampsia. R/o increase severity Mono-di twin gestation Placenta previa Twins with fetal abnormalities P) PIH labs. BPP if unable to get reasonable tracing MDM   Marvene Staff, MD 12:12 PM 09/16/2021  Addendum CBC Latest Ref Rng & Units 09/16/2021 04/04/2019 02/12/2018  WBC 4.0 - 10.5 K/uL 8.8 7.9 9.3  Hemoglobin 12.0 - 15.0 g/dL 11.0(L) 13.9 13.5  Hematocrit 36.0 - 46.0 % 33.7(L) 41.6 40.9  Platelets 150 - 400 K/uL 185 225 275    CMP     Component Value Date/Time   NA 134 (L) 09/16/2021 1102   NA 139 04/04/2019 1405   K 3.7 09/16/2021 1102   CL 103 09/16/2021 1102   CO2 25 09/16/2021 1102   GLUCOSE 85 09/16/2021 1102   BUN 5 (L) 09/16/2021 1102   BUN 10 04/04/2019 1405   CREATININE 0.82 09/16/2021 1102   CALCIUM 9.1 09/16/2021 1102   PROT 5.4 (L) 09/16/2021 1102   PROT 7.1 04/04/2019 1405   ALBUMIN 2.3 (L) 09/16/2021 1102   ALBUMIN 4.9 (H) 04/04/2019 1405   AST 20 09/16/2021 1102   ALT 10 09/16/2021 1102   ALKPHOS 157 (H) 09/16/2021 1102   BILITOT 0.5 09/16/2021 1102   BILITOT 0.8 04/04/2019 1405   GFRNONAA >60 09/16/2021 1102   GFRAA 122 04/04/2019 1405    Urine PCR 0.38  [place] MFM Fetal BPP Wo Non Stress  Result Date: 09/16/2021 ----------------------------------------------------------------------  OBSTETRICS REPORT                        (Signed Final 09/16/2021 05:00 pm) ---------------------------------------------------------------------- Patient Info  ID #:       [id number]                          D.O.B.:  May 31, 1981 (41 yrs)  Name:       Freddy Finner Shepler                  Visit Date: 09/16/2021 02:43 pm ---------------------------------------------------------------------- Performed By  Attending:        Sander Nephew      Ref. Address:      Pam Drown                    MD                                                              OB/GYN  [place] [address]                                                              [place]                                                              [place]  Performed By:     Valda Favia          [facility]                    [place]  Referred By:      Alanda Slim                    Jannatul Wojdyla MD ----------------------------------------------------------------------  Orders  #  Description                           Code        Ordered By  1  [place] MFM FETAL BPP WO NON               76819.01    Donald                                            Damieon Armendariz  2  [place] MFM FETAL BPP WO NST               76819.1     [facility]     ADDL GESTATION                                    Brianna Esson ----------------------------------------------------------------------  #  Order #                     Accession #                Episode #  1  [id number]                   [id number]                 [id number]  2  [id number]                 [id number] ---------------------------------------------------------------------- Indications  [redacted] weeks gestation of pregnancy                 Z3A.72  Advanced maternal age multigravida 7+,         O58.523  third trimester (41 y.o)  Fetal anencephaly (Twin A)                      O35.0XX0 Q00.0  Twin pregnancy, mono/di, third trimester        O30.033  Fetal renal anomaly, fetus 2                    [id number]  Pregnancy resulting from assisted               O4.819  reproductive technology (IVF)  Previous cesarean delivery, antepartum          O75.219  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  Abnormal biochemical screen (AFP MoM            O28.9  11.73; OSBR [id number])  2 vessel umbilical cord (A and B)               O69.89X0  Placenta previa specified as without            O44.03  hemorrhage, third trimester (Twin A)  Hypertension - Gestational                      O13.9 ---------------------------------------------------------------------- Fetal Evaluation (Fetus A)  Num Of Fetuses:          2  Fetal Heart Rate(bpm):   147  Cardiac Activity:        Observed  Presentation:            Cephalic  Placenta:                Posterior Previa  Amniotic Fluid  AFI FV:      Polyhydramnios                              Largest Pocket(cm)                              12.3  ---------------------------------------------------------------------- Biophysical Evaluation (Fetus A)  Amniotic F.V:   Polyhydramnios             F. Tone:         Observed  F. Movement:    Observed                   Score:           6/8  F. Breathing:   Not Observed ---------------------------------------------------------------------- OB History  Gravidity:    2         Term:   1        Prem:   0        SAB:   0  TOP:          0       Ectopic:  0        Living: 1 ---------------------------------------------------------------------- Gestational Age (Fetus A)  LMP:  36w 1d        Date:  01/06/21                 EDD:   10/13/21  Best:          Weston Settle 2d     Det. ByHenrine Screws Transfer          EDD:   11/23/21                                      (03/05/21) ---------------------------------------------------------------------- Anatomy (Fetus A)  Thoracic:              Appears normal         Bladder:                Appears normal  Stomach:               Appears normal, left                         sided ---------------------------------------------------------------------- Fetal Evaluation (Fetus B)  Num Of Fetuses:          2  Fetal Heart Rate(bpm):   129  Cardiac Activity:        Observed  Fetal Lie:               Upper Fetus  Presentation:            Transverse, head to maternal right  Amniotic Fluid  AFI FV:      Within normal limits                              Largest Pocket(cm)                              8.4 ---------------------------------------------------------------------- Biophysical Evaluation (Fetus B)  Amniotic F.V:   Within normal limits       F. Tone:         Observed  F. Movement:    Observed                   Score:           8/8  F. Breathing:   Observed ---------------------------------------------------------------------- Gestational Age (Fetus B)  LMP:           36w 1d        Date:  01/06/21                 EDD:   10/13/21  Best:          Weston Settle 2d     Det. ByHenrine Screws Transfer           EDD:   11/23/21                                      (03/05/21) ---------------------------------------------------------------------- Anatomy (Fetus B)  Thoracic:              Appears normal         Bladder:                Appears normal  Stomach:  Appears normal, left                         sided ---------------------------------------------------------------------- Impression  Antenatal testing for monochorionic diamnoitic twin  pregnancy with anencephaly in twin A with  Twin A normal stomach, polyhydramnios, bladder and BPP  6/8- , Cephalic  Twin B normal stomach, amniotic fluid, bladder and BPP 8/8-  Transverse.  Posterior placenta previa persist in Twin A. ---------------------------------------------------------------------- Recommendations  Continue serial antenatal testing in 2 weeks  Initiate weekly testing at 32 weeks. ----------------------------------------------------------------------               Sander Nephew, MD Electronically Signed Final Report   09/16/2021 05:00 pm ----------------------------------------------------------------------  [place] MFM FETAL BPP WO NST ADDL GESTATION  Result Date: 09/16/2021 ----------------------------------------------------------------------  OBSTETRICS REPORT                        (Signed Final 09/16/2021 05:00 pm) ---------------------------------------------------------------------- Patient Info  ID #:       [id number]                          D.O.B.:  [birth year] (40 yrs)  Name:       [facility]                  Visit Date: 09/16/2021 02:43 pm ---------------------------------------------------------------------- Performed By  Attending:        Sander Nephew      Ref. Address:      Pam Drown                    MD                                                              OB/GYN                                                              [address]                                                               [place]  [place]  Performed By:     Valda Favia          Location[facility]          [facility]                    [place]  Referred By:      Alanda Slim                    Rachelann Enloe MD ---------------------------------------------------------------------- Orders  #  Description                           Code        Ordered By  1  [place] MFM FETAL BPP WO NON               76819.01    [place]                                            Lavel Rieman  2  [place] MFM FETAL BPP WO NST               76819.1     Trudi Morgenthaler     ADDL GESTATION                                    Nahia Nissan ----------------------------------------------------------------------  #  Order #                     Accession #                Episode #  1  [id number]                   [id number]                 [id number]  2  [id number]                   [id number]                 [id number] ---------------------------------------------------------------------- Indications  [redacted] weeks gestation of pregnancy                 Z3A.51  Advanced maternal age multigravida 22+,         O32.523  third trimester (41 y.o)  Fetal anencephaly (Twin A)                      O35.0XX0 Q00.0  Twin pregnancy, mono/di, third trimester        O30.033  Fetal renal anomaly, fetus 2                    [id number]  Pregnancy resulting from assisted               O79.819  reproductive technology (IVF)  Previous cesarean delivery, antepartum          O56.219  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)  Z14.8  Abnormal biochemical screen (AFP MoM            O28.9  11.73; OSBR [id number])  2 vessel umbilical cord (A and B)               O69.89X0  Placenta previa specified as without            O44.03  hemorrhage, third trimester (Twin A)  Hypertension - Gestational                      O13.9  ---------------------------------------------------------------------- Fetal Evaluation (Fetus A)  Num Of Fetuses:          2  Fetal Heart Rate(bpm):   147  Cardiac Activity:        Observed  Presentation:            Cephalic  Placenta:                Posterior Previa  Amniotic Fluid  AFI FV:      Polyhydramnios                              Largest Pocket(cm)                              12.3 ---------------------------------------------------------------------- Biophysical Evaluation (Fetus A)  Amniotic F.V:   Polyhydramnios             F. Tone:         Observed  F. Movement:    Observed                   Score:           6/8  F. Breathing:   Not Observed ---------------------------------------------------------------------- OB History  Gravidity:    2         Term:   1        Prem:   0        SAB:   0  TOP:          0       Ectopic:  0        Living: 1 ---------------------------------------------------------------------- Gestational Age (Fetus A)  LMP:           36w 1d        Date:  01/06/21                 EDD:   10/13/21  Best:          Weston Settle 2d     Det. ByHenrine Screws Transfer          EDD:   11/23/21                                      (03/05/21) ---------------------------------------------------------------------- Anatomy (Fetus A)  Thoracic:              Appears normal         Bladder:                Appears normal  Stomach:               Appears normal, left  sided ---------------------------------------------------------------------- Fetal Evaluation (Fetus B)  Num Of Fetuses:          2  Fetal Heart Rate(bpm):   129  Cardiac Activity:        Observed  Fetal Lie:               Upper Fetus  Presentation:            Transverse, head to maternal right  Amniotic Fluid  AFI FV:      Within normal limits                              Largest Pocket(cm)                              8.4 ---------------------------------------------------------------------- Biophysical Evaluation (Fetus B)   Amniotic F.V:   Within normal limits       F. Tone:         Observed  F. Movement:    Observed                   Score:           8/8  F. Breathing:   Observed ---------------------------------------------------------------------- Gestational Age (Fetus B)  LMP:           36w 1d        Date:  01/06/21                 EDD:   10/13/21  Best:          Weston Settle 2d     Det. ByHenrine Screws Transfer          EDD:   11/23/21                                      (03/05/21) ---------------------------------------------------------------------- Anatomy (Fetus B)  Thoracic:              Appears normal         Bladder:                Appears normal  Stomach:               Appears normal, left                         sided ---------------------------------------------------------------------- Impression  Antenatal testing for monochorionic diamnoitic twin  pregnancy with anencephaly in twin A with  Twin A normal stomach, polyhydramnios, bladder and BPP  6/8- , Cephalic  Twin B normal stomach, amniotic fluid, bladder and BPP 8/8-  Transverse.  Posterior placenta previa persist in Twin A. ---------------------------------------------------------------------- Recommendations  Continue serial antenatal testing in 2 weeks  Initiate weekly testing at 32 weeks. ----------------------------------------------------------------------               Sander Nephew, MD Electronically Signed Final Report   09/16/2021 05:00 pm ----------------------------------------------------------------------    D/c home  Keep scheduled appts( MFM this wk) Preeclampsia warning signs

## 2021-09-16 NOTE — MAU Note (Signed)
Sent in for Pre-E work up.  Denies HA, visual experiences, + pain URQ not as bad today- usually feels like a knife pain, reports swelling in lower extremities, unsure if increased. A little bit of blood in urine this morning, x1. No LOF.reports +FM

## 2021-09-18 LAB — CULTURE, OB URINE

## 2021-09-19 ENCOUNTER — Ambulatory Visit: Payer: BC Managed Care – PPO | Attending: Obstetrics

## 2021-09-19 ENCOUNTER — Ambulatory Visit: Payer: BC Managed Care – PPO | Admitting: *Deleted

## 2021-09-19 ENCOUNTER — Other Ambulatory Visit: Payer: Self-pay

## 2021-09-19 VITALS — BP 121/76 | HR 124

## 2021-09-19 DIAGNOSIS — O30043 Twin pregnancy, dichorionic/diamniotic, third trimester: Secondary | ICD-10-CM

## 2021-09-19 DIAGNOSIS — O30039 Twin pregnancy, monochorionic/diamniotic, unspecified trimester: Secondary | ICD-10-CM | POA: Diagnosis present

## 2021-09-19 DIAGNOSIS — Z3A30 30 weeks gestation of pregnancy: Secondary | ICD-10-CM

## 2021-09-19 DIAGNOSIS — O09813 Supervision of pregnancy resulting from assisted reproductive technology, third trimester: Secondary | ICD-10-CM

## 2021-09-19 DIAGNOSIS — O3502X1 Maternal care for (suspected) central nervous system malformation or damage in fetus, anencephaly, fetus 1: Secondary | ICD-10-CM | POA: Diagnosis not present

## 2021-09-19 DIAGNOSIS — O133 Gestational [pregnancy-induced] hypertension without significant proteinuria, third trimester: Secondary | ICD-10-CM

## 2021-10-03 ENCOUNTER — Other Ambulatory Visit: Payer: Self-pay

## 2021-10-03 ENCOUNTER — Ambulatory Visit: Payer: BC Managed Care – PPO | Admitting: *Deleted

## 2021-10-03 ENCOUNTER — Ambulatory Visit: Payer: BC Managed Care – PPO | Attending: Obstetrics

## 2021-10-03 VITALS — BP 125/78 | HR 107

## 2021-10-03 DIAGNOSIS — O4403 Placenta previa specified as without hemorrhage, third trimester: Secondary | ICD-10-CM | POA: Diagnosis present

## 2021-10-03 DIAGNOSIS — O30039 Twin pregnancy, monochorionic/diamniotic, unspecified trimester: Secondary | ICD-10-CM | POA: Diagnosis present

## 2021-10-03 DIAGNOSIS — O3502X1 Maternal care for (suspected) central nervous system malformation or damage in fetus, anencephaly, fetus 1: Secondary | ICD-10-CM | POA: Diagnosis not present

## 2021-10-03 DIAGNOSIS — R772 Abnormality of alphafetoprotein: Secondary | ICD-10-CM | POA: Diagnosis present

## 2021-10-03 DIAGNOSIS — Z3A32 32 weeks gestation of pregnancy: Secondary | ICD-10-CM

## 2021-10-03 DIAGNOSIS — O35EXX0 Maternal care for other (suspected) fetal abnormality and damage, fetal genitourinary anomalies, not applicable or unspecified: Secondary | ICD-10-CM | POA: Insufficient documentation

## 2021-10-03 DIAGNOSIS — O30033 Twin pregnancy, monochorionic/diamniotic, third trimester: Secondary | ICD-10-CM | POA: Diagnosis not present

## 2021-10-03 DIAGNOSIS — O09523 Supervision of elderly multigravida, third trimester: Secondary | ICD-10-CM | POA: Insufficient documentation

## 2021-10-04 ENCOUNTER — Encounter (HOSPITAL_COMMUNITY): Payer: Self-pay | Admitting: Obstetrics and Gynecology

## 2021-10-04 ENCOUNTER — Inpatient Hospital Stay (HOSPITAL_COMMUNITY)
Admission: AD | Admit: 2021-10-04 | Discharge: 2021-10-10 | DRG: 786 | Disposition: A | Discharge status: Home / Self Care | Payer: BC Managed Care – PPO | Attending: Obstetrics and Gynecology | Admitting: Obstetrics and Gynecology

## 2021-10-04 DIAGNOSIS — O4433 Partial placenta previa with hemorrhage, third trimester: Secondary | ICD-10-CM | POA: Diagnosis present

## 2021-10-04 DIAGNOSIS — O34219 Maternal care for unspecified type scar from previous cesarean delivery: Secondary | ICD-10-CM | POA: Diagnosis present

## 2021-10-04 DIAGNOSIS — O3509X2 Maternal care for (suspected) other central nervous system malformation or damage in fetus, fetus 2: Secondary | ICD-10-CM | POA: Diagnosis present

## 2021-10-04 DIAGNOSIS — O1404 Mild to moderate pre-eclampsia, complicating childbirth: Secondary | ICD-10-CM | POA: Diagnosis present

## 2021-10-04 DIAGNOSIS — Z3A33 33 weeks gestation of pregnancy: Secondary | ICD-10-CM

## 2021-10-04 DIAGNOSIS — O4693 Antepartum hemorrhage, unspecified, third trimester: Secondary | ICD-10-CM

## 2021-10-04 DIAGNOSIS — O322XX2 Maternal care for transverse and oblique lie, fetus 2: Secondary | ICD-10-CM | POA: Diagnosis present

## 2021-10-04 DIAGNOSIS — O3502X1 Maternal care for (suspected) central nervous system malformation or damage in fetus, anencephaly, fetus 1: Secondary | ICD-10-CM | POA: Diagnosis present

## 2021-10-04 DIAGNOSIS — O403XX2 Polyhydramnios, third trimester, fetus 2: Secondary | ICD-10-CM | POA: Diagnosis present

## 2021-10-04 DIAGNOSIS — O42913 Preterm premature rupture of membranes, unspecified as to length of time between rupture and onset of labor, third trimester: Principal | ICD-10-CM | POA: Diagnosis present

## 2021-10-04 DIAGNOSIS — O30033 Twin pregnancy, monochorionic/diamniotic, third trimester: Secondary | ICD-10-CM | POA: Diagnosis present

## 2021-10-04 NOTE — MAU Note (Signed)
Pt Lives [place]. Stared feeling ctx this evening. And felt leaking. Went to local hospital  and they said she was closed and her water had not broke. [facility] Cherly [facility] could evaluate her.

## 2021-10-05 ENCOUNTER — Encounter (HOSPITAL_COMMUNITY): Payer: Self-pay | Admitting: Obstetrics and Gynecology

## 2021-10-05 ENCOUNTER — Other Ambulatory Visit: Payer: Self-pay

## 2021-10-05 ENCOUNTER — Inpatient Hospital Stay (HOSPITAL_BASED_OUTPATIENT_CLINIC_OR_DEPARTMENT_OTHER): Payer: BC Managed Care – PPO

## 2021-10-05 DIAGNOSIS — O09813 Supervision of pregnancy resulting from assisted reproductive technology, third trimester: Secondary | ICD-10-CM | POA: Diagnosis not present

## 2021-10-05 DIAGNOSIS — O4693 Antepartum hemorrhage, unspecified, third trimester: Secondary | ICD-10-CM

## 2021-10-05 DIAGNOSIS — O402XX1 Polyhydramnios, second trimester, fetus 1: Secondary | ICD-10-CM | POA: Diagnosis not present

## 2021-10-05 DIAGNOSIS — Z3A33 33 weeks gestation of pregnancy: Secondary | ICD-10-CM

## 2021-10-05 DIAGNOSIS — O30033 Twin pregnancy, monochorionic/diamniotic, third trimester: Secondary | ICD-10-CM

## 2021-10-05 DIAGNOSIS — O3502X1 Maternal care for (suspected) central nervous system malformation or damage in fetus, anencephaly, fetus 1: Secondary | ICD-10-CM | POA: Diagnosis not present

## 2021-10-05 DIAGNOSIS — Z0372 Encounter for suspected placental problem ruled out: Secondary | ICD-10-CM | POA: Diagnosis not present

## 2021-10-05 DIAGNOSIS — O34219 Maternal care for unspecified type scar from previous cesarean delivery: Secondary | ICD-10-CM | POA: Diagnosis not present

## 2021-10-05 DIAGNOSIS — O401XX1 Polyhydramnios, first trimester, fetus 1: Secondary | ICD-10-CM | POA: Diagnosis not present

## 2021-10-05 LAB — CBC
HCT: 35.6 % — ABNORMAL LOW (ref 36.0–46.0)
Hemoglobin: 11.7 g/dL — ABNORMAL LOW (ref 12.0–15.0)
MCH: 27.7 pg (ref 26.0–34.0)
MCHC: 32.9 g/dL (ref 30.0–36.0)
MCV: 84.2 fL (ref 80.0–100.0)
Platelets: 185 10*3/uL (ref 150–400)
RBC: 4.23 MIL/uL (ref 3.87–5.11)
RDW: 13.5 % (ref 11.5–15.5)
WBC: 12.9 10*3/uL — ABNORMAL HIGH (ref 4.0–10.5)
nRBC: 0 % (ref 0.0–0.2)

## 2021-10-05 LAB — AMNISURE RUPTURE OF MEMBRANE (ROM) NOT AT ARMC: Amnisure ROM: POSITIVE

## 2021-10-05 LAB — URINALYSIS, ROUTINE W REFLEX MICROSCOPIC
Bilirubin Urine: NEGATIVE
Glucose, UA: NEGATIVE mg/dL
Ketones, ur: NEGATIVE mg/dL
Nitrite: NEGATIVE
Protein, ur: 30 mg/dL — AB
Specific Gravity, Urine: 1.01 (ref 1.005–1.030)
WBC, UA: >50 WBC/hpf — ABNORMAL HIGH (ref 0–5)
pH: 5 (ref 5.0–8.0)

## 2021-10-05 LAB — RPR: RPR Ser Ql: NONREACTIVE

## 2021-10-05 MED ORDER — ONDANSETRON HCL 4 MG/2ML IJ SOLN
4.0000 mg | Freq: Three times a day (TID) | INTRAMUSCULAR | Status: DC | PRN
  Administered 2021-10-06: 4 mg via INTRAVENOUS
  Filled 2021-10-05: qty 2

## 2021-10-05 MED ORDER — ZOLPIDEM TARTRATE 5 MG PO TABS
5.0000 mg | ORAL_TABLET | Freq: Every evening | ORAL | Status: DC | PRN
  Administered 2021-10-05 – 2021-10-06 (×2): 5 mg via ORAL
  Filled 2021-10-05 (×2): qty 1

## 2021-10-05 MED ORDER — MAGNESIUM SULFATE 40 GM/1000ML IV SOLN
2.0000 g/h | INTRAVENOUS | Status: AC
  Administered 2021-10-05: 2 g/h via INTRAVENOUS
  Filled 2021-10-05 (×2): qty 1000

## 2021-10-05 MED ORDER — LIDOCAINE HCL (PF) 1 % IJ SOLN
INTRAMUSCULAR | Status: AC
  Administered 2021-10-05: 2 mL via INTRADERMAL
  Filled 2021-10-05: qty 30

## 2021-10-05 MED ORDER — NIFEDIPINE 10 MG PO CAPS
10.0000 mg | ORAL_CAPSULE | ORAL | Status: AC | PRN
  Administered 2021-10-05 (×3): 10 mg via ORAL
  Filled 2021-10-05 (×3): qty 1

## 2021-10-05 MED ORDER — NIFEDIPINE 10 MG PO CAPS
10.0000 mg | ORAL_CAPSULE | Freq: Once | ORAL | Status: AC
  Administered 2021-10-05: 10 mg via ORAL
  Filled 2021-10-05: qty 1

## 2021-10-05 MED ORDER — LACTATED RINGERS IV BOLUS
1000.0000 mL | Freq: Once | INTRAVENOUS | Status: AC
  Administered 2021-10-05: 1000 mL via INTRAVENOUS

## 2021-10-05 MED ORDER — AMOXICILLIN 500 MG PO CAPS: 500.0000 mg | ORAL_CAPSULE | Freq: Three times a day (TID) | ORAL | Status: DC

## 2021-10-05 MED ORDER — AZITHROMYCIN 250 MG PO TABS
1000.0000 mg | ORAL_TABLET | Freq: Once | ORAL | Status: AC
  Administered 2021-10-05: 1000 mg via ORAL
  Filled 2021-10-05: qty 4

## 2021-10-05 MED ORDER — MAGNESIUM SULFATE 40 GM/1000ML IV SOLN: 2.0000 g/h | Freq: Once | INTRAVENOUS | Status: DC

## 2021-10-05 MED ORDER — DOCUSATE SODIUM 100 MG PO CAPS: 100.0000 mg | ORAL_CAPSULE | Freq: Every day | ORAL | Status: DC

## 2021-10-05 MED ORDER — CALCIUM CARBONATE ANTACID 500 MG PO CHEW: 2.0000 | CHEWABLE_TABLET | ORAL | Status: DC | PRN

## 2021-10-05 MED ORDER — LACTATED RINGERS IV SOLN: INTRAVENOUS | Status: DC

## 2021-10-05 MED ORDER — SODIUM CHLORIDE 0.9 % IV SOLN
2.0000 g | Freq: Four times a day (QID) | INTRAVENOUS | Status: DC
  Administered 2021-10-05 – 2021-10-06 (×7): 2 g via INTRAVENOUS
  Filled 2021-10-05 (×7): qty 2000

## 2021-10-05 MED ORDER — LIDOCAINE HCL (PF) 1 % IJ SOLN: 2.0000 mL | Freq: Once | INTRAMUSCULAR | Status: AC

## 2021-10-05 MED ORDER — SOD CITRATE-CITRIC ACID 500-334 MG/5ML PO SOLN
ORAL | Status: AC
  Filled 2021-10-05: qty 30

## 2021-10-05 MED ORDER — MAGNESIUM SULFATE 4 GM/100ML IV SOLN: 4.0000 g | Freq: Once | INTRAVENOUS | Status: DC

## 2021-10-05 MED ORDER — PANTOPRAZOLE SODIUM 40 MG IV SOLR
40.0000 mg | Freq: Once | INTRAVENOUS | Status: AC
  Administered 2021-10-05: 40 mg via INTRAVENOUS
  Filled 2021-10-05: qty 40

## 2021-10-05 MED ORDER — PRENATAL MULTIVITAMIN CH
1.0000 | ORAL_TABLET | Freq: Every day | ORAL | Status: DC
  Filled 2021-10-05 (×2): qty 1

## 2021-10-05 MED ORDER — MAGNESIUM SULFATE BOLUS VIA INFUSION
4.0000 g | Freq: Once | INTRAVENOUS | Status: AC
  Administered 2021-10-05: 4 g via INTRAVENOUS
  Filled 2021-10-05: qty 1000

## 2021-10-05 MED ORDER — BETAMETHASONE SOD PHOS & ACET 6 (3-3) MG/ML IJ SUSP
12.0000 mg | INTRAMUSCULAR | Status: AC
  Administered 2021-10-05 – 2021-10-06 (×2): 12 mg via INTRAMUSCULAR
  Filled 2021-10-05 (×2): qty 5

## 2021-10-05 MED ORDER — ACETAMINOPHEN 325 MG PO TABS: 650.0000 mg | ORAL_TABLET | ORAL | Status: DC | PRN

## 2021-10-05 NOTE — Progress Notes (Signed)
HD #1  33 1/7 wk PPROM on amp/azithromycin Placenta previa PMC on magnesium sulfate BMZ #1   S: c/o thirst reports ctx less painful active fetuses. Denies leaking fluid  O: BP 129/84    Pulse (!) 131    Temp 98.3 F (36.8 C) (Oral)    Resp 20    LMP 01/06/2021    SpO2 100%   Lungs clear to A Cor tachycardia  Abd soft gravid Pad none.  Mucus brownish red noted with voiding Extr  tr edema  Tracing: twin A:  baseline 120  Twin B: baseline 120  (+) accels to 140  Ctx q 2-5 mins  GBS cx pending  IMP: PMC on Magnesium sulfate PPROM  on Amp/azitromycin Twin gestation @ 33 1/7 Fetal anomaly( anencephaly) twin A Polyhydramnios ( twin A) Twin B( ventriculomegaly) Placenta Previa Vaginal bleeding third trimester  P) stop magnesium after 2nd dose of BMZ NICU consult Repeat sonogram to check placenta previa ? Marginal separation Cont IV antibiotics for PPROM Await ucx  MFM consult

## 2021-10-05 NOTE — MAU Provider Note (Signed)
History     Cc: contractions HPI 41 yo G3P1011 MF with known twin gestation @ [redacted] wk gestation presents with complaint of contractions since late pm. Pt had underwear wet  ? Leaking. Had small amount of blood. Pt went to her nearest hospital where she was found to be Nitrazine neg. She was contracting and left to come here. Per pt she was closed . On presentation here pt is noted to be contracting. PNC complicated by 1 twin A with anencephaly and borderline polyhydramnios 2) twin b with ventriculomegaly, SUA, and solitary kidney. 3) placenta previa 4) previous C/S,5) IVF pregnancy  6) AMA   OB History     Gravida  3   Para  1   Term  1   Preterm  0   AB  1   Living  1      SAB  1   IAB  0   Ectopic  0   Multiple  0   Live Births  1           Past Medical History:  Diagnosis Date   Abnormal Pap smear of cervix ~2002 & ~ 2006   COLPO biopsy with first abnormal   Anxiety    Depression    History of IBS     Past Surgical History:  Procedure Laterality Date   AUGMENTATION MAMMAPLASTY     implants   BREAST SURGERY     CESAREAN SECTION  01/1999   CYST EXCISION  2006   seb. cyst upper back    CYST EXCISION Left 2010   left buttock   INTRAUTERINE DEVICE INSERTION  08/22/13   Mirena, first Mirena inserted 08/2008   LAPAROSCOPIC CHOLECYSTECTOMY     REFRACTIVE SURGERY      Family History  Problem Relation Age of Onset   Cancer Father 40       Prostate    Hypertension Maternal Grandmother    Cancer Maternal Grandfather     Social History   Tobacco Use   Smoking status: Never   Smokeless tobacco: Never  Vaping Use   Vaping Use: Never used  Substance Use Topics   Alcohol use: Not Currently    Comment: not while preg   Drug use: No    Allergies:  Allergies  Allergen Reactions   Percocet [Oxycodone-Acetaminophen] Nausea And Vomiting   Amitiza [Lubiprostone]     Medications Prior to Admission  Medication Sig Dispense Refill Last Dose   aspirin  EC 81 MG tablet Take 81 mg by mouth daily. Swallow whole.   10/04/2021   doxylamine, Sleep, (UNISOM) 25 MG tablet Take 25 mg by mouth at bedtime as needed.   10/03/2021   FOLIC ACID PO Take by mouth.   10/04/2021   pantoprazole (PROTONIX) 40 MG tablet Take 40 mg by mouth daily.   10/04/2021   Prenatal Vit-Fe Fumarate-FA (PRENATAL MULTIVITAMIN) TABS tablet Take 1 tablet by mouth daily at 12 noon.   10/04/2021     Physical Exam   Blood pressure 131/85, pulse (!) 112, temperature 98.6 F (37 C), resp. rate 18, last menstrual period 01/06/2021.  General appearance: alert, cooperative, and mild distress Lungs: clear to auscultation bilaterally Abdomen:  gravid tight Pelvic: cervix normal in appearance, external genitalia normal, and dark bloody mucus glob with visual closed Extremities: no edema, redness or tenderness in the calves or thighs Tracing A) baseline 120 (+) accel B) baseline 130 (+) accel 150 good variability Ctx q 2 mins  ED Course  IMP : PMC Vaginal bleeding in 3rd trimester probably 2nd to placenta previa and/or marginal abruption Twin gestation @ 33 wk with fetal anomaly twin A IVF pregnancy P) Nifedipine oral q 20 mins  x 3. Amniosure. Gp B strep culture. IVF bolus   MDM   Serita Kyle, MD 2:19 AM 10/05/2021  Addendum:  Amnisure: positive Nifedipine 20 mg x 3 dose given Ctx spaced  Will admit . Start magnesium to help get steroid dose

## 2021-10-05 NOTE — Consult Note (Signed)
Consultation Service: Neonatology   Dr. Garwin Baxter has asked for consultation on Felicia Baxter Baxter with Felicia Baxter gestation at 72 weeks with Felicia Baxter Baxter, PPROM,  and  Vaginal bleeding 3rd trimester.   Reason for consult:  Explain the possible complications, the prognosis, and the care of Baxter premature infant at 71 weeks.   Chief complaint: 41 yo female with Baxter 25w0dintrauterine Felicia Baxter pregnancy and PPROM.  My key findings of this patient's HPI are:  Pregnancy complicated by Felicia Baxter Baxter anencephaly, polyhydramnios; Felicia Baxter Baxter  ventriculomegaly, SUA, solitary kidney; IVF and AMA.  I have reviewed the patient's chart and have met with her. The salient information is as follows:   Prenatal labs: ABO, Rh: --/--/O POS (01/28 0120) Antibody: NEG (01/28 0120) Rubella:  Immune RPR:   NR HBsAg:   neg HIV:   neg GBS:   pending  Maternal Diabetes: No Genetic Screening: Amniocentesis on 2nd Felicia Baxter with normal karyotype and micro array Maternal Ultrasounds/Referrals: Other: Fetal Ultrasounds or other Referrals:  Fetal echo, Referred to Materal Fetal [facility]  Maternal Substance Abuse:  No  Maternal Steroids: First dose 10/05/2021 Fetus Baxter: 1493 g Fetus Baxter: 2007 g    My recommendations for this patient and my actions included:   1. In the presence of the Felicia Baxter Baxter and partner, I spent 55 minutes discussing the possible complications and outcomes of prematurity at this gestational age. We discussed in detail the parent's desires for palliative care for Felicia Baxter Baxter. Mother would like to hold infant immediately after delivery and spend as much time as possible with infant. Mother would like infant care normalized as much as possible including offering feeds as able( via nipple, spoon or syringe), taking baby's measurements, and making of keepsake's. For Felicia Baxter Baxter I discussed the potential need for CPAP and oxygen at birth and possible surfactant administration for respiratory distress, IV fluids pending establishment of enteral feeds  (encouraged breast milk feeding to which she planned to do), antibiotics for possible sepsis, temperature support, possible need for umbilical lines and monitoring. I also discussed the potential length of stay in the neonatal intensive care unit for about 4-6 weeks. I discussed this with parents in detail and they expressed an understanding of the risks and complications of prematurity.   2. I informed her that the NICU team would be present at the delivery. We discussed that Felicia Baxter Baxter will be taken to the NICU for care and observation initially. They desire the father to accompany infant to the NICU. We discussed initial care of infant then further evaluation of ventriculomegaly and single kidney. We discussed possible consulting providers including but not limited to neurology, neurosurgery, urology, nephrology and genetics.  3. Baxter visit to the NICU by the infant's mother and/or Baxter significant other was encouraged. Visitation policy was discussed.  466 Mother wishes to name Felicia Baxter Baxter [place]and Felicia Baxter Baxter.   Final Impression:  41yo female with  Felicia Baxter gestation at 347 [place] P[place],  and Vaginal bleeding 3rd trimester complicated by Felicia Baxter Baxter anencephaly, polyhydramnios; Felicia Baxter Baxter  ventriculomegaly, SUA, solitary kidney; IVF and AMA.  Mother is threatening to deliver and now understands the possible complications and prognosis of her infants.    Felicia Baxter Belling MD Attending Neonatologist

## 2021-10-05 NOTE — H&P (Addendum)
Felicia Baxter is a 41 y.o. female G3P1011 MF at 21 wk  twin gestation admitted for PPROM, PMC.,Placenta previa with some vaginal bleeding . Pt was given nifedpine x 3 doses. (+) amniosure. PNC complicated by twin A anencephaly, borderline polyhydramnios, twin B  ventriculomegaly, SUA, solitary kidney 2) IVF preg 3) AMA OB History     Gravida  3   Para  1   Term  1   Preterm  0   AB  1   Living  1      SAB  1   IAB  0   Ectopic  0   Multiple  0   Live Births  1          Past Medical History:  Diagnosis Date   Abnormal Pap smear of cervix ~2002 & ~ 2006   COLPO biopsy with first abnormal   Anxiety    Depression    History of IBS    Past Surgical History:  Procedure Laterality Date   AUGMENTATION MAMMAPLASTY     implants   BREAST SURGERY     CESAREAN SECTION  01/1999   CYST EXCISION  2006   seb. cyst upper back    CYST EXCISION Left 2010   left buttock   INTRAUTERINE DEVICE INSERTION  08/22/13   Mirena, first Mirena inserted 08/2008   LAPAROSCOPIC CHOLECYSTECTOMY     REFRACTIVE SURGERY     Family History: family history includes Cancer in her maternal grandfather; Cancer (age of onset: 54) in her father; Hypertension in her maternal grandmother. Social History:  reports that she has never smoked. She has never used smokeless tobacco. She reports that she does not currently use alcohol. She reports that she does not use drugs.     Maternal Diabetes: No Genetic Screening: Declined but pt hac amniocentesis on 2nd twin with nl studies Maternal Ultrasounds/Referrals: Other: Fetal Ultrasounds or other Referrals:  Fetal echo, Referred to Materal Fetal Medicine  Maternal Substance Abuse:  No Significant Maternal Medications:  Meds include: Protonix Other: baseline 13 Significant Maternal Lab Results:  Other:  Other Comments:  IVF pregnancy, AMA, anencephalic  with borderline polyhydramnios twin A, twin B  ventriculomegaly, SUA,  solitary kidney, mild  preeclampsia dx 1/9  Review of Systems  All other systems reviewed and are negative. History   Blood pressure 130/80, pulse (!) 114, temperature 98.5 F (36.9 C), temperature source Oral, resp. rate 18, last menstrual period 01/06/2021, SpO2 95 %. Maternal Exam:  Introitus: Normal vulva.  Physical Exam Constitutional:      Appearance: Normal appearance.  HENT:     Head: Atraumatic.  Eyes:     Extraocular Movements: Extraocular movements intact.  Cardiovascular:     Rate and Rhythm: Regular rhythm.     Heart sounds: Normal heart sounds.  Pulmonary:     Breath sounds: Normal breath sounds.  Abdominal:     Comments: Gravid tight Pfannensteil skin incision  Genitourinary:    General: Normal vulva.     Comments: Closed cervix Vagina mucoid dark red/brown blood Musculoskeletal:        General: Normal range of motion.     Cervical back: Neck supple.  Skin:    General: Skin is warm and dry.  Neurological:     General: No focal deficit present.     Mental Status: She is alert and oriented to person, place, and time.  Psychiatric:        Mood and Affect: Mood normal.  Behavior: Behavior normal.    Prenatal labs: ABO, Rh: --/--/O POS (01/28 0120) Antibody: NEG (01/28 0120) Rubella:  Immune RPR:   NR HBsAg:   neg HIV:   neg GBS:   pending  CBC Latest Ref Rng & Units 10/05/2021 09/16/2021 04/04/2019  WBC 4.0 - 10.5 K/uL 12.9(H) 8.8 7.9  Hemoglobin 12.0 - 15.0 g/dL 11.7(L) 11.0(L) 13.9  Hematocrit 36.0 - 46.0 % 35.6(L) 33.7(L) 41.6  Platelets 150 - 400 K/uL 185 185 225  [place] MFM FETAL BPP WO NON STRESS  Result Date: 10/03/2021 ----------------------------------------------------------------------  OBSTETRICS REPORT                       (Signed Final 10/03/2021 05:16 pm) ---------------------------------------------------------------------- Patient Info  ID #:       [id number]                          D.O.B.:  07/08/1981 (40 yrs)  Name:       Felicia Baxter                   Visit Date: 10/03/2021 02:51 pm ---------------------------------------------------------------------- Performed By  Attending:        Johnell Comings MD         Ref. Address:     [facility]                                                             OB/[facility]N                                                             [address]                                                             [place]                                                             [place]  Performed By:     Rodrigo Ran BS      Location:         [facility]                    [facility]                                                             [facility]                                                             [facility]  Referred By:      Alanda Slim                    Chyane Greer MD ---------------------------------------------------------------------- Orders  #  Description                           Code        Ordered By  1  [place] MFM FETAL BPP WO NON               76819.01    YU FANG     STRESS  2  [place] MFM OB FOLLOW UP                   76816.01    YU FANG  3  [place] MFM FETAL BPP WO NST               58832.5     YU FANG     ADDL GESTATION  4  [place] MFM OB FOLLOW UP ADDL              49826.41    YU FANG     GEST ----------------------------------------------------------------------  #  Order #                     Accession #                Episode #  1  [id number]                   [id number]                 [id number]  2  [id number]                   [id number]                 [id number]  3  [id number]                   [id number]                 [id number]  4  [id number]                   [id number]                 [id number] ---------------------------------------------------------------------- Indications  Fetal anencephaly (Twin A)                     O35.0XX0 Q00.0  Advanced maternal age multigravida 86+,        O58.523   third trimester (41 y.o)  [redacted] weeks gestation of pregnancy  Z3A.32  Twin pregnancy, mono/di, third trimester       O30.033  Fetal renal anomaly, fetus 2                   O35.7MC9  Pregnancy resulting from assisted              O82.819  reproductive technology (IVF)  Previous cesarean delivery, antepartum         O22.219  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)                   Z14.8  Abnormal biochemical screen (AFP MoM           O28.9  11.73; OSBR 4:70)  2 vessel umbilical cord (A and B)              O69.89X0  Placenta previa specified as without           O44.03  hemorrhage, third trimester (Twin A)  Hypertension - Gestational                     O13.9 ---------------------------------------------------------------------- Fetal Evaluation (Fetus A)  Num Of Fetuses:         2  Fetal Heart Rate(bpm):  137  Cardiac Activity:       Observed  Fetal Lie:              Lower Fetus  Presentation:           Cephalic  Placenta:               Posterior Previa  P. Cord Insertion:      Not well visualized  Membrane Desc:      Dividing Membrane seen - Dichorionic.  Amniotic Fluid  AFI FV:      Polyhydramnios                              Largest Pocket(cm)                              14.8 ---------------------------------------------------------------------- Biophysical Evaluation (Fetus A)  Amniotic F.V:   Polyhydramnios             F. Tone:        Observed  F. Movement:    Observed                   Score:          6/8  F. Breathing:   Not Observed ---------------------------------------------------------------------- Biometry (Fetus A)  AC:      244.8  mm     G. Age:  28w 5d        < 1  %  FL:       60.2  mm     G. Age:  31w 2d          9  %    FL/AC:      24.6   %    20 - 24  HUM:      50.5  mm     G. Age:  29w 4d        < 5  %  Est. FW:    1493  gm      3 lb 5 oz    1.1  %     FW Discordancy  26  % ---------------------------------------------------------------------- OB History  Gravidity:    2         Term:   1         Prem:   0        SAB:   0  TOP:          0       Ectopic:  0        Living: 1 ---------------------------------------------------------------------- Gestational Age (Fetus A)  LMP:           38w 4d        Date:  01/06/21                 EDD:   10/13/21  U/S Today:     30w 0d                                        EDD:   12/12/21  Best:          32w 5d     Det. ByHenrine Screws Transfer          EDD:   11/23/21                                      (03/05/21) ---------------------------------------------------------------------- Anatomy (Fetus A)  Cranium:               Acrania/exencepha      Stomach:                Appears normal, left                         ly                                                                        sided  Lips:                  Previously seen        Abdomen:                Previously seen  Palate:                Not well visualized    Abdominal Wall:         Previously seen  Thoracic:              Previously seen        Cord Vessels:           2 vessel cord,                                                                        absent right umb a  Heart:  Previously seen; EIF   Kidneys:                Appear normal  RVOT:                  Previously seen        Bladder:                Appears normal  LVOT:                  Previously seen        Spine:                  Previously seen  Aortic Arch:           Previously seen        Upper Extremities:      Previously seen  Ductal Arch:           Previously seen        Lower Extremities:      Previously seen  Diaphragm:             Not well visualized ---------------------------------------------------------------------- Doppler - Fetal Vessels (Fetus A)  Umbilical Artery   S/D     %tile      RI    %tile      PI    %tile            ADFV    RDFV   2.55       45    0.61       53    0.84       40               No      No ---------------------------------------------------------------------- Fetal Evaluation (Fetus B)  Num  Of Fetuses:         2  Fetal Heart Rate(bpm):  129  Cardiac Activity:       Observed  Fetal Lie:              Upper Fetus  Presentation:           Variable  Placenta:               Anterior  P. Cord Insertion:      Previously Visualized  Membrane Desc:      Dividing Membrane seen - Monochorionic  Amniotic Fluid  AFI FV:      Within normal limits                              Largest Pocket(cm)                              7.3 ---------------------------------------------------------------------- Biophysical Evaluation (Fetus B)  Amniotic F.V:   Within normal limits       F. Tone:        Observed  F. Movement:    Observed                   Score:          8/8  F. Breathing:   Observed ---------------------------------------------------------------------- Biometry (Fetus B)  BPD:      94.7  mm     G. Age:  38w 4d       > 99  %    CI:  78.87   %    70 - 86                                                          FL/HC:      18.1   %    19.9 - 21.5  HC:      337.2  mm     G. Age:  38w 5d       > 99  %    HC/AC:      1.25        0.96 - 1.11  AC:      270.6  mm     G. Age:  31w 1d         11  %    FL/BPD:     64.3   %    71 - 87  FL:       60.9  mm     G. Age:  31w 4d         14  %    FL/AC:      22.5   %    20 - 24  HUM:        58  mm     G. Age:  33w 4d         76  %  Est. FW:    2007  gm      4 lb 7 oz     36  %     FW Discordancy     0 \ 26 % ---------------------------------------------------------------------- Gestational Age (Fetus B)  LMP:           38w 4d        Date:  01/06/21                 EDD:   10/13/21  U/S Today:     35w 0d                                        EDD:   11/07/21  Best:          32w 5d     Det. ByHenrine Screws Transfer          EDD:   11/23/21                                      (03/05/21) ---------------------------------------------------------------------- Anatomy (Fetus B)  Cranium:               Appears normal         LVOT:                   Previously seen  Cavum:                  Previously seen        Aortic Arch:            Not well visualized  Ventricles:            Bilat.ventriculo,      Ductal Arch:  Previously seen                         Rt=14  Lt= 12  Choroid Plexus:        Previously seen        Diaphragm:              Previously seen  Cerebellum:            Previously seen        Stomach:                Appears normal, left                                                                        sided  Posterior Fossa:       Previously seen        Abdomen:                Previously seen  Nuchal Fold:           Previously seen        Abdominal Wall:         Previously seen  Face:                  Orbits and profile     Cord Vessels:           2 vessel cord,                         previously seen                                                                        absent left umb art  Lips:                  Previously seen        Kidneys:                Absent right kidney  Palate:                Not well visualized    Bladder:                Appears normal  Thoracic:              Appears normal         Spine:                  Previously seen  Heart:                 Previously seen        Upper Extremities:      Previously seen  RVOT:                  Previously seen        Lower Extremities:      Previously seen ---------------------------------------------------------------------- Doppler - Fetal  Vessels (Fetus B)  Umbilical Artery   S/D     %tile      RI    %tile      PI    %tile            ADFV    RDFV   2.32       30    0.57       34    0.83       37               No      No ---------------------------------------------------------------------- Cervix Uterus Adnexa  Cervix  Length:            4.6  cm.  Normal appearance by transabdominal scan.  Uterus  No abnormality visualized.  Right Ovary  Not visualized.  Left Ovary  Not visualized.  Cul De Sac  No free fluid seen.  Adnexa  No abnormality visualized.  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  She denies any problems since her last  exam.  Polyhydramnios continues to be noted around twin A.  Borderline polyhydramnios with a maximal vertical pocket of  7.3 cm is noted around twin B.  The fetal growth for twin B (EFW 4 pounds 7 ounces, 36  percentile) is appropriate for her gestational age.  A BPP performed for twin A was 6 out of 8.  Twin A received  a -2 for fetal breathing movements that did not meet criteria.  It is questionable if twin A will have fetal breathing  movements due to anencephaly.  A BPP performed for twin B was 8 out of 8.  Bilateral ventriculomegaly measuring 1.2 to 1.4 cm continues  to be noted in twin B's brain.  This is a stable finding as  compared to her prior exam.  The patient has already met with the palliative care team.  A  plan is already in place for the postmortem management of  the anencephalic fetus.  We will continue to follow her with weekly BPP's until delivery.  As the posterior placenta for twin A is difficult to visualize  abdominally, we will perform a transvaginal ultrasound in 2  weeks to ensure that the placenta previa has resolved.  Due to this complicated twin pregnancy, a cesarean delivery  is recommended at between 36 to 37 weeks.  The patient will  speak to you during her prenatal visit next week to arrange  her delivery date.  Another BPP was scheduled in 1 week. ----------------------------------------------------------------------                   Johnell Comings, MD Electronically Signed Final Report   10/03/2021 05:16 pm ----------------------------------------------------------------------  [place] MFM OB FOLLOW UP  Result Date:  10/03/2021 ----------------------------------------------------------------------  OBSTETRICS REPORT                       (Signed Final 10/03/2021 05:16 pm) ---------------------------------------------------------------------- Patient Info  ID #:       [id number]                          D.O.B.:  Mar 17, 1981 (40 yrs)  Name:       Felicia Baxter  Visit Date: 10/03/2021 02:51 pm ---------------------------------------------------------------------- Performed By  Attending:        Johnell Comings MD         Ref. Address:     [facility]                                                             OB/GYN                                                             [address]                                                             [place]                                                             [place]  Performed By:     Rodrigo Ran BS      Location:         [facility]                    RDMS RVT                                 [facility]                                                             [facility]                                                             [facility]  Referred By:      Alanda Slim                    Lyla Jasek MD ---------------------------------------------------------------------- Orders  #  Description  Code        Ordered By  1  [place] MFM FETAL BPP WO NON               [id number]    YU FANG     STRESS  2  [place] MFM OB FOLLOW UP                   76816.01    YU FANG  3  [place] MFM FETAL BPP WO NST               76819.1     YU FANG     ADDL GESTATION  4  [place] MFM OB FOLLOW UP ADDL              42706.23    YU FANG     GEST ----------------------------------------------------------------------  #  Order #                     Accession #                Episode #  1  [id number]                   [id number]                 [id number]  2  [id number]                   [id number]                  [id number]  3  [id number]                   [id number]                 [id number]  4  [id number]                   [id number]                 [id number] ---------------------------------------------------------------------- Indications  Fetal anencephaly (Twin A)                     O35.0XX0 Q00.0  Advanced maternal age multigravida 58+,        O51.523  third trimester (41 y.o)  [redacted] weeks gestation of pregnancy                Z3A.72  Twin pregnancy, mono/di, third trimester       O30.033  Fetal renal anomaly, fetus 2                   O35.5LZ7  Pregnancy resulting from assisted              O46.819  reproductive technology (IVF)  Previous cesarean delivery, antepartum         O1.219  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 Cord Insertion:      Not well visualized  Membrane Desc:      Dividing Membrane seen - Dichorionic.  Amniotic Fluid  AFI FV:      Polyhydramnios                              Largest Pocket(cm)                              14.8 ---------------------------------------------------------------------- Biophysical Evaluation (Fetus A)  Amniotic F.V:   Polyhydramnios             F. Tone:        Observed  F. Movement:    Observed                   Score:          6/8  F. Breathing:   Not Observed ---------------------------------------------------------------------- Biometry (Fetus A)  AC:      244.8  mm     G. Age:   28w 5d        < 1  %  FL:       60.2  mm     G. Age:  31w 2d          9  %    FL/AC:      24.6   %    20 - 24  HUM:      50.5  mm     G. Age:  29w 4d        < 5  %  Est. FW:    1493  gm      3 lb 5 oz    1.1  %     FW Discordancy        26  % ---------------------------------------------------------------------- OB History  Gravidity:    2         Term:   1        Prem:   0        SAB:   0  TOP:          0       Ectopic:  0        Living: 1 ---------------------------------------------------------------------- Gestational Age (Fetus A)  LMP:           38w 4d        Date:  01/06/21                 EDD:   10/13/21  U/S Today:     30w 0d                                        EDD:   12/12/21  Best:          Milderd Meager  5d     Det. ByHenrine Screws Transfer          EDD:   11/23/21                                      (03/05/21) ---------------------------------------------------------------------- Anatomy (Fetus A)  Cranium:               Acrania/exencepha      Stomach:                Appears normal, left                         ly                                                                        sided  Lips:                  Previously seen        Abdomen:                Previously seen  Palate:                Not well visualized    Abdominal Wall:         Previously seen  Thoracic:              Previously seen        Cord Vessels:           2 vessel cord,                                                                        absent right umb a  Heart:                 Previously seen; EIF   Kidneys:                Appear normal  RVOT:                  Previously seen        Bladder:                Appears normal  LVOT:                  Previously seen        Spine:                  Previously seen  Aortic Arch:           Previously seen        Upper Extremities:      Previously seen  Ductal Arch:           Previously seen        Lower Extremities:      Previously seen  Diaphragm:  Not well visualized  ---------------------------------------------------------------------- Doppler - Fetal Vessels (Fetus A)  Umbilical Artery   S/D     %tile      RI    %tile      PI    %tile            ADFV    RDFV   2.55       45    0.61       53    0.84       40               No      No ---------------------------------------------------------------------- Fetal Evaluation (Fetus B)  Num Of Fetuses:         2  Fetal Heart Rate(bpm):  129  Cardiac Activity:       Observed  Fetal Lie:              Upper Fetus  Presentation:           Variable  Placenta:               Anterior  P. Cord Insertion:      Previously Visualized  Membrane Desc:      Dividing Membrane seen - Monochorionic  Amniotic Fluid  AFI FV:      Within normal limits                              Largest Pocket(cm)                              7.3 ---------------------------------------------------------------------- Biophysical Evaluation (Fetus B)  Amniotic F.V:   Within normal limits       F. Tone:        Observed  F. Movement:    Observed                   Score:          8/8  F. Breathing:   Observed ---------------------------------------------------------------------- Biometry (Fetus B)  BPD:      94.7  mm     G. Age:  38w 4d       > 99  %    CI:        78.87   %    70 - 86                                                          FL/HC:      18.1   %    19.9 - 21.5  HC:      337.2  mm     G. Age:  38w 5d       > 99  %    HC/AC:      1.25        0.96 - 1.11  AC:      270.6  mm     G. Age:  31w 1d         11  %    FL/BPD:     64.3   %    71 - 87  FL:       60.9  mm  G. Age:  31w 4d         14  %    FL/AC:      22.5   %    20 - 24  HUM:        58  mm     G. Age:  33w 4d         15  %  Est. FW:    2007  gm      4 lb 7 oz     36  %     FW Discordancy     0 \ 26 % ---------------------------------------------------------------------- Gestational Age (Fetus B)  LMP:           38w 4d        Date:  01/06/21                 EDD:   10/13/21  U/S Today:     35w 0d                                         EDD:   11/07/21  Best:          32w 5d     Det. ByHenrine Screws Transfer          EDD:   11/23/21                                      (03/05/21) ---------------------------------------------------------------------- Anatomy (Fetus B)  Cranium:               Appears normal         LVOT:                   Previously seen  Cavum:                 Previously seen        Aortic Arch:            Not well visualized  Ventricles:            Bilat.ventriculo,      Ductal Arch:            Previously seen                         Rt=14  Lt= 12  Choroid Plexus:        Previously seen        Diaphragm:              Previously seen  Cerebellum:            Previously seen        Stomach:                Appears normal, left                                                                        sided  Posterior Fossa:       Previously seen  Abdomen:                Previously seen  Nuchal Fold:           Previously seen        Abdominal Wall:         Previously seen  Face:                  Orbits and profile     Cord Vessels:           2 vessel cord,                         previously seen                                                                        absent left umb art  Lips:                  Previously seen        Kidneys:                Absent right kidney  Palate:                Not well visualized    Bladder:                Appears normal  Thoracic:              Appears normal         Spine:                  Previously seen  Heart:                 Previously seen        Upper Extremities:      Previously seen  RVOT:                  Previously seen        Lower Extremities:      Previously seen ---------------------------------------------------------------------- Doppler - Fetal Vessels (Fetus B)  Umbilical Artery   S/D     %tile      RI    %tile      PI    %tile            ADFV    RDFV   2.32       30    0.57       34    0.83       37               No      No  ---------------------------------------------------------------------- Cervix Uterus Adnexa  Cervix  Length:            4.6  cm.  Normal appearance by transabdominal scan.  Uterus  No abnormality visualized.  Right Ovary  Not visualized.  Left Ovary  Not visualized.  Cul De Sac  No free fluid seen.  Adnexa  No abnormality visualized. 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  She denies any problems since her last  exam.  Polyhydramnios continues to be noted around twin A.  Borderline polyhydramnios with a maximal vertical pocket of  7.3 cm is noted around twin B.  The fetal growth for twin B (EFW 4 pounds 7 ounces, 36  percentile) is appropriate for her gestational age.  A BPP performed for twin A was 6 out of 8.  Twin A received  a -2 for fetal breathing movements that did not meet criteria.  It is questionable if twin A will have fetal breathing  movements due to anencephaly.  A BPP performed for twin B was 8 out of 8.  Bilateral ventriculomegaly measuring 1.2 to 1.4 cm continues  to be noted in twin B's brain.  This is a stable finding as  compared to her prior exam.  The patient has already met with the palliative care team.  A  plan is already in place for the postmortem management of  the anencephalic fetus.  We will continue to follow her with weekly BPP's until delivery.  As the posterior placenta for twin A is difficult to visualize  abdominally, we will perform a transvaginal ultrasound in 2  weeks to ensure that the placenta previa has resolved.  Due to this complicated twin pregnancy, a cesarean delivery  is recommended at between 36 to 37 weeks.  The patient will  speak to you during her prenatal visit next week to arrange  her delivery date.  Another BPP was  scheduled in 1 week. ----------------------------------------------------------------------                   Johnell Comings, MD Electronically Signed Final Report   10/03/2021 05:16 pm ----------------------------------------------------------------------  [place] MFM OB FOLLOW UP ADDL GEST  Result Date: 10/03/2021 ----------------------------------------------------------------------  OBSTETRICS REPORT                       (Signed Final 10/03/2021 05:16 pm) ---------------------------------------------------------------------- Patient Info  ID #:       [id number]                          D.O.B.:  02-Jun-1981 (40 yrs)  Name:       Felicia Baxter                  Visit Date: 10/03/2021 02:51 pm ---------------------------------------------------------------------- Performed By  Attending:        Johnell Comings MD         Ref. Address:     [facility]                                                             [facility]                                                             [address]                                                             [place]                                                             [place]  Performed By:     Rodrigo Ran BS      Location:         [facility]                    RDMS RVT                                 [facility]                                                             [facility]                                                             [facility]  Referred By:      Alanda Slim                    Kaniya Trueheart MD ---------------------------------------------------------------------- Orders  #  Description                           Code        Ordered By  1  [place] MFM FETAL BPP WO NON               76819.01    YU FANG     STRESS  2  [place] MFM OB FOLLOW UP                   76816.01    YU FANG  3  [place] MFM FETAL BPP WO NST               37169.6     YU FANG     ADDL GESTATION  4  [place] MFM OB FOLLOW UP  ADDL              78938.10    YU FANG     GEST ----------------------------------------------------------------------  #  Order #                     Accession #                Episode #  1  [id number]  [id number]                 [id number]  2  [id number]                   [id number]                 [id number]  3  [id number]                   [id number]                 [id number]  4  [id number]                   [id number]                 [id number] ---------------------------------------------------------------------- Indications  Fetal anencephaly (Twin A)                     O35.0XX0 Q00.0  Advanced maternal age multigravida 51+,        O40.523  third trimester (41 y.o)  [redacted] weeks gestation of pregnancy                Z3A.45  Twin pregnancy, mono/di, third trimester       O30.033  Fetal renal anomaly, fetus 2                   O35.0LI1  Pregnancy resulting from assisted              O39.819  reproductive technology (IVF)  Previous cesarean delivery, antepartum         O51.219  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)                   Z14.8  Abnormal biochemical screen (AFP MoM           O28.9  11.73; OSBR 0:30)  2 vessel umbilical cord (A and B)              O69.89X0  Placenta previa specified as without           O44.03  hemorrhage, third trimester (Twin A)  Hypertension - Gestational                     O13.9 ---------------------------------------------------------------------- Fetal Evaluation (Fetus A)  Num Of Fetuses:         2  Fetal Heart Rate(bpm):  137  Cardiac Activity:       Observed  Fetal Lie:              Lower Fetus  Presentation:           Cephalic  Placenta:               Posterior Previa  P. Cord Insertion:      Not well visualized  Membrane Desc:      Dividing Membrane seen - Dichorionic.  Amniotic Fluid  AFI FV:      Polyhydramnios                              Largest Pocket(cm)                              14.8 ---------------------------------------------------------------------- Biophysical  Evaluation (Fetus A)  Amniotic F.V:   Polyhydramnios  F. Tone:        Observed  F. Movement:    Observed                   Score:          6/8  F. Breathing:   Not Observed ---------------------------------------------------------------------- Biometry (Fetus A)  AC:      244.8  mm     G. Age:  28w 5d        < 1  %  FL:       60.2  mm     G. Age:  31w 2d          9  %    FL/AC:      24.6   %    20 - 24  HUM:      50.5  mm     G. Age:  29w 4d        < 5  %  Est. FW:    1493  gm      3 lb 5 oz    1.1  %     FW Discordancy        26  % ---------------------------------------------------------------------- OB History  Gravidity:    2         Term:   1        Prem:   0        SAB:   0  TOP:          0       Ectopic:  0        Living: 1 ---------------------------------------------------------------------- Gestational Age (Fetus A)  LMP:           38w 4d        Date:  01/06/21                 EDD:   10/13/21  U/S Today:     30w 0d                                        EDD:   12/12/21  Best:          32w 5d     Det. ByHenrine Screws Transfer          EDD:   11/23/21                                      (03/05/21) ---------------------------------------------------------------------- Anatomy (Fetus A)  Cranium:               Acrania/exencepha      Stomach:                Appears normal, left                         ly                                                                        sided  Lips:  Previously seen        Abdomen:                Previously seen  Palate:                Not well visualized    Abdominal Wall:         Previously seen  Thoracic:              Previously seen        Cord Vessels:           2 vessel cord,                                                                        absent right umb a  Heart:                 Previously seen; EIF   Kidneys:                Appear normal  RVOT:                  Previously seen        Bladder:                Appears normal  LVOT:                   Previously seen        Spine:                  Previously seen  Aortic Arch:           Previously seen        Upper Extremities:      Previously seen  Ductal Arch:           Previously seen        Lower Extremities:      Previously seen  Diaphragm:             Not well visualized ---------------------------------------------------------------------- Doppler - Fetal Vessels (Fetus A)  Umbilical Artery   S/D     %tile      RI    %tile      PI    %tile            ADFV    RDFV   2.55       45    0.61       53    0.84       40               No      No ---------------------------------------------------------------------- Fetal Evaluation (Fetus B)  Num Of Fetuses:         2  Fetal Heart Rate(bpm):  129  Cardiac Activity:       Observed  Fetal Lie:              Upper Fetus  Presentation:           Variable  Placenta:               Anterior  P. Cord Insertion:      Previously Visualized  Membrane Desc:      Dividing  Membrane seen - Monochorionic  Amniotic Fluid  AFI FV:      Within normal limits                              Largest Pocket(cm)                              7.3 ---------------------------------------------------------------------- Biophysical Evaluation (Fetus B)  Amniotic F.V:   Within normal limits       F. Tone:        Observed  F. Movement:    Observed                   Score:          8/8  F. Breathing:   Observed ---------------------------------------------------------------------- Biometry (Fetus B)  BPD:      94.7  mm     G. Age:  38w 4d       > 99  %    CI:        78.87   %    70 - 86                                                          FL/HC:      18.1   %    19.9 - 21.5  HC:      337.2  mm     G. Age:  38w 5d       > 99  %    HC/AC:      1.25        0.96 - 1.11  AC:      270.6  mm     G. Age:  31w 1d         11  %    FL/BPD:     64.3   %    71 - 87  FL:       60.9  mm     G. Age:  31w 4d         14  %    FL/AC:      22.5   %    20 - 24  HUM:        58  mm     G. Age:  33w 4d          76  %  Est. FW:    2007  gm      4 lb 7 oz     36  %     FW Discordancy     0 \ 26 % ---------------------------------------------------------------------- Gestational Age (Fetus B)  LMP:           38w 4d        Date:  01/06/21                 EDD:   10/13/21  U/S Today:     35w 0d                                        EDD:   11/07/21  Best:  32w 5d     Det. ByHenrine Screws Transfer          EDD:   11/23/21                                      (03/05/21) ---------------------------------------------------------------------- Anatomy (Fetus B)  Cranium:               Appears normal         LVOT:                   Previously seen  Cavum:                 Previously seen        Aortic Arch:            Not well visualized  Ventricles:            Bilat.ventriculo,      Ductal Arch:            Previously seen                         Rt=14  Lt= 12  Choroid Plexus:        Previously seen        Diaphragm:              Previously seen  Cerebellum:            Previously seen        Stomach:                Appears normal, left                                                                        sided  Posterior Fossa:       Previously seen        Abdomen:                Previously seen  Nuchal Fold:           Previously seen        Abdominal Wall:         Previously seen  Face:                  Orbits and profile     Cord Vessels:           2 vessel cord,                         previously seen                                                                        absent left umb art  Lips:                  Previously seen  Kidneys:                Absent right kidney  Palate:                Not well visualized    Bladder:                Appears normal  Thoracic:              Appears normal         Spine:                  Previously seen  Heart:                 Previously seen        Upper Extremities:      Previously seen  RVOT:                  Previously seen        Lower Extremities:      Previously seen  ---------------------------------------------------------------------- Doppler - Fetal Vessels (Fetus B)  Umbilical Artery   S/D     %tile      RI    %tile      PI    %tile            ADFV    RDFV   2.32       30    0.57       34    0.83       37               No      No ---------------------------------------------------------------------- Cervix Uterus Adnexa  Cervix  Length:            4.6  cm.  Normal appearance by transabdominal scan.  Uterus  No abnormality visualized.  Right Ovary  Not visualized.  Left Ovary  Not visualized.  Cul De Sac  No free fluid seen.  Adnexa  No abnormality visualized. 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  She denies any problems since her last  exam.  Polyhydramnios continues to be noted around twin A.  Borderline polyhydramnios with a maximal vertical pocket of  7.3 cm is noted around twin B.  The fetal growth for twin B (EFW 4 pounds 7 ounces, 36  percentile) is appropriate for her gestational age.  A BPP performed for twin A was 6 out of 8.  Twin A received  a -2 for fetal breathing movements that did not meet criteria.  It is questionable if twin A will have fetal breathing  movements due to anencephaly.  A BPP performed for twin B was 8 out of 8.  Bilateral ventriculomegaly measuring 1.2 to 1.4 cm continues  to be noted in twin B's brain.  This is a stable finding as  compared to her prior exam.  The patient has already met with the palliative care team.  A  plan is already in place for the postmortem management of  the anencephalic fetus.  We will continue to follow her with weekly BPP's until delivery.  As the posterior placenta for twin A is difficult to visualize  abdominally, we will perform a transvaginal ultrasound in 2  weeks to  ensure that  the placenta previa has resolved.  Due to this complicated twin pregnancy, a cesarean delivery  is recommended at between 36 to 37 weeks.  The patient will  speak to you during her prenatal visit next week to arrange  her delivery date.  Another BPP was scheduled in 1 week. ----------------------------------------------------------------------                   Johnell Comings, MD Electronically Signed Final Report   10/03/2021 05:16 pm ----------------------------------------------------------------------  [place] MFM FETAL BPP WO NST ADDL GESTATION  Result Date: 10/03/2021 ----------------------------------------------------------------------  OBSTETRICS REPORT                       (Signed Final 10/03/2021 05:16 pm) ---------------------------------------------------------------------- Patient Info  ID #:       [id number]                          D.O.B.:  [birth year] (40 yrs)  Name:       Felicia Baxter                  Visit Date: 10/03/2021 02:51 pm ---------------------------------------------------------------------- Performed By  Attending:        Johnell Comings MD         Ref. Address:     [facility]                                                             OB/[facility]                                                             [address]                                                             [place]                                                             [place]  Performed By:     Rodrigo Ran BS      Location:         [facility]                    RDMS RVT  [facility]                                                             [place] [facility]                                                             [facility]  Referred By:      Alanda Slim                    Shiesha Jahn MD ---------------------------------------------------------------------- Orders  #  Description                            Code        Ordered By  1  [place] MFM FETAL BPP WO NON               76819.01    YU FANG     STRESS  2  [place] MFM OB FOLLOW UP                   76816.01    YU FANG  3  [place] MFM FETAL BPP WO NST               16109.6     YU FANG     ADDL GESTATION  4  [place] MFM OB FOLLOW UP ADDL              04540.98    YU FANG     GEST ----------------------------------------------------------------------  #  Order #                     Accession #                Episode #  1  [id number]                   [id number]                 [id number]  2  [id number]                   [id number]                 [id number]  3  [id number]                   [id number]                 [id number]  4  [id number]                   [id number]                 [id number] ---------------------------------------------------------------------- Indications  Fetal anencephaly (Twin A)                     O35.0XX0 Q00.0  Advanced maternal age multigravida 57+,        O77.523  third trimester (41 y.o)  [redacted] weeks gestation of pregnancy  Z3A.32  Twin pregnancy, mono/di, third trimester       O30.033  Fetal renal anomaly, fetus 2                   O35.1BZ2  Pregnancy resulting from assisted              O73.819  reproductive technology (IVF)  Previous cesarean delivery, antepartum         O22.219  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)                   Z14.8  Abnormal biochemical screen (AFP MoM           O28.9  11.73; OSBR 0:80)  2 vessel umbilical cord (A and B)              O69.89X0  Placenta previa specified as without           O44.03  hemorrhage, third trimester (Twin A)  Hypertension - Gestational                     O13.9 ---------------------------------------------------------------------- Fetal Evaluation (Fetus A)  Num Of Fetuses:         2  Fetal Heart Rate(bpm):  137  Cardiac Activity:       Observed  Fetal Lie:              Lower Fetus  Presentation:           Cephalic  Placenta:               Posterior Previa  P. Cord Insertion:      Not well  visualized  Membrane Desc:      Dividing Membrane seen - Dichorionic.  Amniotic Fluid  AFI FV:      Polyhydramnios                              Largest Pocket(cm)                              14.8 ---------------------------------------------------------------------- Biophysical Evaluation (Fetus A)  Amniotic F.V:   Polyhydramnios             F. Tone:        Observed  F. Movement:    Observed                   Score:          6/8  F. Breathing:   Not Observed ---------------------------------------------------------------------- Biometry (Fetus A)  AC:      244.8  mm     G. Age:  28w 5d        < 1  %  FL:       60.2  mm     G. Age:  31w 2d          9  %    FL/AC:      24.6   %    20 - 24  HUM:      50.5  mm     G. Age:  29w 4d        < 5  %  Est. FW:    1493  gm      3 lb 5 oz    1.1  %     FW Discordancy  26  % ---------------------------------------------------------------------- OB History  Gravidity:    2         Term:   1        Prem:   0        SAB:   0  TOP:          0       Ectopic:  0        Living: 1 ---------------------------------------------------------------------- Gestational Age (Fetus A)  LMP:           38w 4d        Date:  01/06/21                 EDD:   10/13/21  U/S Today:     30w 0d                                        EDD:   12/12/21  Best:          32w 5d     Det. ByHenrine Screws Transfer          EDD:   11/23/21                                      (03/05/21) ---------------------------------------------------------------------- Anatomy (Fetus A)  Cranium:               Acrania/exencepha      Stomach:                Appears normal, left                         ly                                                                        sided  Lips:                  Previously seen        Abdomen:                Previously seen  Palate:                Not well visualized    Abdominal Wall:         Previously seen  Thoracic:              Previously seen        Cord Vessels:           2 vessel  cord,                                                                        absent right umb a  Heart:  Previously seen; EIF   Kidneys:                Appear normal  RVOT:                  Previously seen        Bladder:                Appears normal  LVOT:                  Previously seen        Spine:                  Previously seen  Aortic Arch:           Previously seen        Upper Extremities:      Previously seen  Ductal Arch:           Previously seen        Lower Extremities:      Previously seen  Diaphragm:             Not well visualized ---------------------------------------------------------------------- Doppler - Fetal Vessels (Fetus A)  Umbilical Artery   S/D     %tile      RI    %tile      PI    %tile            ADFV    RDFV   2.55       45    0.61       53    0.84       40               No      No ---------------------------------------------------------------------- Fetal Evaluation (Fetus B)  Num Of Fetuses:         2  Fetal Heart Rate(bpm):  129  Cardiac Activity:       Observed  Fetal Lie:              Upper Fetus  Presentation:           Variable  Placenta:               Anterior  P. Cord Insertion:      Previously Visualized  Membrane Desc:      Dividing Membrane seen - Monochorionic  Amniotic Fluid  AFI FV:      Within normal limits                              Largest Pocket(cm)                              7.3 ---------------------------------------------------------------------- Biophysical Evaluation (Fetus B)  Amniotic F.V:   Within normal limits       F. Tone:        Observed  F. Movement:    Observed                   Score:          8/8  F. Breathing:   Observed ---------------------------------------------------------------------- Biometry (Fetus B)  BPD:      94.7  mm     G. Age:  38w 4d       > 99  %    CI:        78.87   %  70 - 86                                                          FL/HC:      18.1   %    19.9 - 21.5  HC:      337.2  mm     G. Age:  38w  5d       > 99  %    HC/AC:      1.25        0.96 - 1.11  AC:      270.6  mm     G. Age:  31w 1d         11  %    FL/BPD:     64.3   %    71 - 87  FL:       60.9  mm     G. Age:  31w 4d         14  %    FL/AC:      22.5   %    20 - 24  HUM:        58  mm     G. Age:  33w 4d         76  %  Est. FW:    2007  gm      4 lb 7 oz     36  %     FW Discordancy     0 \ 26 % ---------------------------------------------------------------------- Gestational Age (Fetus B)  LMP:           38w 4d        Date:  01/06/21                 EDD:   10/13/21  U/S Today:     35w 0d                                        EDD:   11/07/21  Best:          32w 5d     Det. ByHenrine Screws Transfer          EDD:   11/23/21                                      (03/05/21) ---------------------------------------------------------------------- Anatomy (Fetus B)  Cranium:               Appears normal         LVOT:                   Previously seen  Cavum:                 Previously seen        Aortic Arch:            Not well visualized  Ventricles:            Bilat.ventriculo,      Ductal Arch:            Previously seen  Rt=14  Lt= 12  Choroid Plexus:        Previously seen        Diaphragm:              Previously seen  Cerebellum:            Previously seen        Stomach:                Appears normal, left                                                                        sided  Posterior Fossa:       Previously seen        Abdomen:                Previously seen  Nuchal Fold:           Previously seen        Abdominal Wall:         Previously seen  Face:                  Orbits and profile     Cord Vessels:           2 vessel cord,                         previously seen                                                                        absent left umb art  Lips:                  Previously seen        Kidneys:                Absent right kidney  Palate:                Not well visualized    Bladder:                 Appears normal  Thoracic:              Appears normal         Spine:                  Previously seen  Heart:                 Previously seen        Upper Extremities:      Previously seen  RVOT:                  Previously seen        Lower Extremities:      Previously seen ---------------------------------------------------------------------- Doppler - Fetal Vessels (Fetus B)  Umbilical Artery   S/D     %tile      RI    %tile  PI    %tile            ADFV    RDFV   2.32       30    0.57       34    0.83       37               No      No ---------------------------------------------------------------------- Cervix Uterus Adnexa  Cervix  Length:            4.6  cm.  Normal appearance by transabdominal scan.  Uterus  No abnormality visualized.  Right Ovary  Not visualized.  Left Ovary  Not visualized.  Cul De Sac  No free fluid seen.  Adnexa  No abnormality visualized. 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  She denies any problems since her last  exam.  Polyhydramnios continues to be noted around twin A.  Borderline polyhydramnios with a maximal vertical pocket of  7.3 cm is noted around twin B.  The fetal growth for twin B (EFW 4 pounds 7 ounces, 36  percentile) is appropriate for her gestational age.  A BPP performed for twin A was 6 out of 8.  Twin A received  a -2 for fetal breathing movements that did not meet criteria.  It is questionable if twin A will have fetal breathing  movements due to anencephaly.  A BPP performed for twin B was 8 out of 8.  Bilateral ventriculomegaly measuring 1.2 to 1.4 cm continues  to be noted in twin B's brain.  This is a stable finding as  compared to her prior exam.  The patient has already met with the palliative care team.  A  plan is  already in place for the postmortem management of  the anencephalic fetus.  We will continue to follow her with weekly BPP's until delivery.  As the posterior placenta for twin A is difficult to visualize  abdominally, we will perform a transvaginal ultrasound in 2  weeks to ensure that the placenta previa has resolved.  Due to this complicated twin pregnancy, a cesarean delivery  is recommended at between 36 to 37 weeks.  The patient will  speak to you during her prenatal visit next week to arrange  her delivery date.  Another BPP was scheduled in 1 week. ----------------------------------------------------------------------                   Johnell Comings, MD Electronically Signed Final Report   10/03/2021 05:16 pm ----------------------------------------------------------------------     Assessment/Plan: [facility] PPROM Placenta previa  Vaginal bleeding 3rd trimester Twin gestation@ 33 wk  IVF AMA Previous C/S P) admit  Magnesium  sulfate to get steroid. PPROM antibiotics Routine labs. BMZ  Korde Jeppsen A Wanza Szumski 10/05/2021, 3:20 AM

## 2021-10-05 NOTE — Consult Note (Addendum)
Maternal-Fetal Medicine   Name: Felicia Baxter DOB: 31-Jul-1981 MRN: [id number] Referring Provider: Servando Salina, MD  I had the pleasure of seeing Felicia Baxter today at the [facility]. She si G3 P1011 at 33-weeks' gestation and was admitted today morning with c/o vaginal bleeding. PPROM was suspected on the basis of positive amnisure test.  Patient had gone to [place] yesterday to be with her parents for baby shower.  She had minimal vaginal bleeding and was evaluated at a local hospital told her that they could not provide the proper OB care for her.  Patient returned to [facility] and was evaluated at the [facility].  She reports no gushing of amniotic fluid per vagina.  The bleeding is minimal and is almost resolved. Since admission, patient received first dose of betamethasone and is on magnesium sulfate infusion.  She is also getting IV ampicillin.  She has abdominal tightness and feels uncomfortable. Patient has been under the joint [facility] with [facility].  She has monochorionic-diamniotic twins discordant for anencephaly.  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  Amniocentesis was performed at 19 weeks and twin B that showed normal karyotype and MicroArray.  Significant ultrasound findings in twin B include bilateral mild ventriculomegaly, absent right kidney and single umbilical artery.  On ultrasound performed 2 days ago, fetal growth was appropriate for gestational age in twin B and the estimated fetal weight was 2,007 g.  P/E; Patient is slightly uncomfortable when lying down and feels minimal uterine contractions. BP 105/59, pulse 122/min, afebrile. Abdomen: Skin feels stretched (polyhyramnios); no tenderness.  NST: Baseline 120 bpm; reactive.  Labs: WBC 12.9, hemoglobin 11.7, hematocrit 35.6, platelets 185, blood type O positive. Ultrasound Twin A: Cephalic presentation, posterior placenta.  Polyhydramnios is seen (maximum vertical pocket 13 cm).  Good fetal  heart activity seen.  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  Twin B: Transverse lie and head to maternal right, anterior placenta.  Amniotic fluid is normal.  Good fetal heart activity seen.  Anatomical survey was not performed.  We performed transvaginal ultrasound to rule out placenta previa.  The cervix was dilated and no placenta previa was seen.  After informed consent, under ultrasound guidance an 18-gauge needle was inserted after giving local anesthesia with lidocaine (3 mL).  The needle could not be placed into the amniotic cavity because of the intervening intertwine membrane and the needle was withdrawn.  Another place in the maternal abdomen (left lateral) was chosen and 3 mL of lidocaine was injected.  An 18-gauge needle was inserted into the amniotic cavity of TWIN A under ultrasound guidance and a total of 1,300 milliliters of clear amniotic fluid was withdrawn (into 2 containers).  The procedure has to be stopped because of maternal nausea and vomiting and the needle was withdrawn. Patient was later comfortable after the procedure.  Pre-and postprocedure heart rate were normal.  Fetal heart rate was checked in twin B during the procedure there was normal.  Blood type: O positive.  Our concerns include: Monochorionic-diamniotic twin pregnancy with polyhydramnios in twin A with preterm labor -I explained that preterm labor is due to polyhydramnios.  Maternal discomfort is because of polyhydramnios. -I reassured the couple of normal placental position. -Polyhydramnios and no measurable cervix on ultrasound or significant findings and the likelihood of preterm delivery is high. -I discussed amnio reduction procedure that will provide maternal comfort and possibly prolong the pregnancy.  Patient was told that the likelihood of preterm premature rupture membranes or preterm delivery is higher and  is independent of the procedure performed.  In normal circumstances, the risk of preterm delivery from the  procedure is 1 and 500 procedures.  Placental abruption can rarely follow amnio reduction procedure. -Alternatively, expectant management can continue as an inpatient.  Patient informed that she would like to have amnioreduction procedure that may relieve some discomfort she is experiencing and also prolong the pregnancy.  She understands the risk of preterm delivery. Patient understands that emergency cesarean section may be performed if fetal heart activity is reassuring.  Recommendations -Continue magnesium sulfate for 48 hours. -Complete steroid course. -NST as per protocol (magnesium sulfate infusion). -Consent for cesarean delivery.  Watch for vaginal bleeding.  Thank you for consultation.  If you have any questions or concerns, please contact me the [facility].

## 2021-10-06 ENCOUNTER — Encounter (HOSPITAL_COMMUNITY)
Admission: AD | Disposition: A | Discharge status: Home / Self Care | Payer: Self-pay | Source: Home / Self Care | Attending: Obstetrics and Gynecology

## 2021-10-06 LAB — CULTURE, OB URINE

## 2021-10-06 SURGERY — Surgical Case
Anesthesia: Spinal | Wound class: Clean Contaminated

## 2021-10-06 MED ORDER — ONDANSETRON HCL 4 MG/2ML IJ SOLN
4.0000 mg | Freq: Once | INTRAMUSCULAR | Status: AC
  Administered 2021-10-06: 4 mg via INTRAVENOUS
  Filled 2021-10-06: qty 2

## 2021-10-06 MED ORDER — NIFEDIPINE 10 MG PO CAPS
10.0000 mg | ORAL_CAPSULE | Freq: Three times a day (TID) | ORAL | Status: DC
  Administered 2021-10-06: 10 mg via ORAL
  Filled 2021-10-06: qty 1

## 2021-10-06 MED ORDER — BUPIVACAINE HCL (PF) 0.25 % IJ SOLN
INTRAMUSCULAR | Status: AC
  Filled 2021-10-06: qty 10

## 2021-10-06 MED ORDER — MORPHINE SULFATE (PF) 0.5 MG/ML IJ SOLN
INTRAMUSCULAR | Status: AC
  Filled 2021-10-06: qty 10

## 2021-10-06 MED ORDER — FENTANYL CITRATE (PF) 100 MCG/2ML IJ SOLN
INTRAMUSCULAR | Status: AC
  Filled 2021-10-06: qty 2

## 2021-10-06 MED ORDER — SOD CITRATE-CITRIC ACID 500-334 MG/5ML PO SOLN
ORAL | Status: AC
  Filled 2021-10-06: qty 30

## 2021-10-06 MED ORDER — NIFEDIPINE 10 MG PO CAPS: 10.0000 mg | ORAL_CAPSULE | Freq: Once | ORAL | Status: AC

## 2021-10-06 MED ORDER — LACTATED RINGERS IV BOLUS
500.0000 mL | Freq: Once | INTRAVENOUS | Status: AC
  Administered 2021-10-06: 500 mL via INTRAVENOUS

## 2021-10-06 MED ORDER — SODIUM CHLORIDE 0.9 % IV SOLN
25.0000 mg | Freq: Once | INTRAVENOUS | Status: DC
  Filled 2021-10-06: qty 1

## 2021-10-06 MED ORDER — PHENYLEPHRINE 40 MCG/ML (10ML) SYRINGE FOR IV PUSH (FOR BLOOD PRESSURE SUPPORT)
PREFILLED_SYRINGE | INTRAVENOUS | Status: AC
  Filled 2021-10-06: qty 10

## 2021-10-06 MED ORDER — NIFEDIPINE 10 MG PO CAPS: 10.0000 mg | ORAL_CAPSULE | Freq: Four times a day (QID) | ORAL | Status: DC

## 2021-10-06 SURGICAL SUPPLY — 42 items
BARRIER ADHS 3X4 INTERCEED (GAUZE/BANDAGES/DRESSINGS) ×2 IMPLANT
BENZOIN TINCTURE PRP APPL 2/3 (GAUZE/BANDAGES/DRESSINGS) ×1 IMPLANT
CHLORAPREP W/TINT 26ML (MISCELLANEOUS) ×2 IMPLANT
CLAMP CORD UMBIL (MISCELLANEOUS) IMPLANT
CLOTH BEACON ORANGE TIMEOUT ST (SAFETY) ×2 IMPLANT
DRAPE C SECTION CLR SCREEN (DRAPES) ×2 IMPLANT
DRSG OPSITE POSTOP 4X10 (GAUZE/BANDAGES/DRESSINGS) ×2 IMPLANT
ELECT REM PT RETURN 9FT ADLT (ELECTROSURGICAL) ×2
ELECTRODE REM PT RTRN 9FT ADLT (ELECTROSURGICAL) ×1 IMPLANT
EXTRACTOR VACUUM M CUP 4 TUBE (SUCTIONS) IMPLANT
GLOVE BIOGEL PI IND STRL 7.0 (GLOVE) ×2 IMPLANT
GLOVE BIOGEL PI INDICATOR 7.0 (GLOVE) ×2
GLOVE ECLIPSE 6.5 STRL STRAW (GLOVE) ×2 IMPLANT
GOWN STRL REUS W/TWL LRG LVL3 (GOWN DISPOSABLE) ×4 IMPLANT
KIT ABG SYR 3ML LUER SLIP (SYRINGE) IMPLANT
NDL HYPO 25X5/8 SAFETYGLIDE (NEEDLE) IMPLANT
NEEDLE HYPO 22GX1.5 SAFETY (NEEDLE) ×2 IMPLANT
NEEDLE HYPO 25X5/8 SAFETYGLIDE (NEEDLE) IMPLANT
NS IRRIG 1000ML POUR BTL (IV SOLUTION) ×2 IMPLANT
PACK C SECTION WH (CUSTOM PROCEDURE TRAY) ×2 IMPLANT
PAD OB MATERNITY 4.3X12.25 (PERSONAL CARE ITEMS) ×2 IMPLANT
RTRCTR C-SECT PINK 25CM LRG (MISCELLANEOUS) IMPLANT
STRIP CLOSURE SKIN 1/2X4 (GAUZE/BANDAGES/DRESSINGS) ×1 IMPLANT
SUT CHROMIC GUT AB #0 18 (SUTURE) IMPLANT
SUT MNCRL 0 VIOLET CTX 36 (SUTURE) ×3 IMPLANT
SUT MON AB 2-0 SH 27 (SUTURE)
SUT MON AB 2-0 SH27 (SUTURE) IMPLANT
SUT MON AB 3-0 SH 27 (SUTURE)
SUT MON AB 3-0 SH27 (SUTURE) IMPLANT
SUT MON AB 4-0 PS1 27 (SUTURE) ×1 IMPLANT
SUT MONOCRYL 0 CTX 36 (SUTURE) ×3
SUT PLAIN 2 0 (SUTURE) ×1
SUT PLAIN 2 0 XLH (SUTURE) IMPLANT
SUT PLAIN ABS 2-0 CT1 27XMFL (SUTURE) IMPLANT
SUT VIC AB 0 CT1 36 (SUTURE) ×5 IMPLANT
SUT VIC AB 2-0 CT1 27 (SUTURE) ×1
SUT VIC AB 2-0 CT1 TAPERPNT 27 (SUTURE) ×1 IMPLANT
SUT VIC AB 4-0 PS2 27 (SUTURE) IMPLANT
SYR CONTROL 10ML LL (SYRINGE) ×2 IMPLANT
TOWEL OR 17X24 6PK STRL BLUE (TOWEL DISPOSABLE) ×2 IMPLANT
TRAY FOLEY W/BAG SLVR 14FR LF (SET/KITS/TRAYS/PACK) IMPLANT
WATER STERILE IRR 1000ML POUR (IV SOLUTION) ×2 IMPLANT

## 2021-10-06 NOTE — Progress Notes (Signed)
Called by RN due to  pt  c/o more painful ctx  Despite procardia  Also dry heaving and increase leg swelling  Vs: BP 112/62 (BP Location: Left Arm)    Pulse (!) 121    Temp 98.4 F (36.9 C) (Oral)    Resp 20    Wt 78.3 kg    LMP 01/06/2021    SpO2 98%    BMI 28.72 kg/m   VE: 5/100/-2/-1 Abd: gravid tight Ext 1-2 +edema  Tracing; A: baseline 120 (+) accel  B baseline 120 (+) variability Ctx q 2-3 mins  IMP: unstoppable PTL Twin gestation  Anencephalic twin A Polyhydramnios twin A Twin B: mild bilateral ventriculomegaly Previous C/S  IVF AMA P) proceed with repeat C/S. Risk of surgery reviewed with pt and husband: Infection, bleeding, possible need for blood transfusion and its risk( HIv, acute rxn, hepatitis) Injury to bladder, bowel, ureter, internal scar tissue. All ? answered

## 2021-10-06 NOTE — Anesthesia Preprocedure Evaluation (Signed)
Anesthesia Evaluation  Patient identified by MRN, date of birth, ID band Patient awake    Reviewed: Allergy & Precautions, H&P , NPO status , Patient's Chart, lab work & pertinent test results  History of Anesthesia Complications Negative for: history of anesthetic complications  Airway Mallampati: II  TM Distance: >3 FB     Dental   Pulmonary neg pulmonary ROS,    Pulmonary exam normal        Cardiovascular negative cardio ROS   Rhythm:regular Rate:Normal     Neuro/Psych negative neurological ROS  negative psych ROS   GI/Hepatic negative GI ROS, Neg liver ROS,   Endo/Other  negative endocrine ROS  Renal/GU negative Renal ROS  negative genitourinary   Musculoskeletal   Abdominal   Peds  Hematology negative hematology ROS (+)        Component                Value               Date/Time                 HGB                      11.7 (L)            10/05/2021 0120           HGB                      13.9                04/04/2019 1405           HGB                      13.5                09/26/2016 1506           HCT                      35.6 (L)            10/05/2021 0120           HCT                      41.6                04/04/2019 1405           PLT                      185                 10/05/2021 0120           PLT                      225                 04/04/2019 1405        Anesthesia Other Findings   Reproductive/Obstetrics (+) Pregnancy G3P1011 at [redacted]w[redacted]d Twin gestation, in preterm labor                             Anesthesia Physical Anesthesia Plan  ASA: 2 and emergent  Anesthesia Plan: Spinal   Post-op Pain Management:  Induction:   PONV Risk Score and Plan: Ondansetron and Treatment may vary due to age or medical condition  Airway Management Planned:   Additional Equipment:   Intra-op Plan:   Post-operative Plan:   Informed Consent: I have reviewed  the patients History and Physical, chart, labs and discussed the procedure including the risks, benefits and alternatives for the proposed anesthesia with the patient or authorized representative who has indicated his/her understanding and acceptance.       Plan Discussed with: Anesthesiologist  Anesthesia Plan Comments:         Anesthesia Quick Evaluation

## 2021-10-06 NOTE — Progress Notes (Signed)
HD #2 33 1/7 week [place] PPROM BMZ complete Twin gestation  S: (+) FM  x 2. Feels better since Magnesium discontinued. Notes contractions but not as painful and spaced. S/p amniocentesis( reduction) for symptomatic Polyhydramnios. Notes only pink. Denies leakage of fluid  O; BP 113/63 (BP Location: Left Arm)    Pulse (!) 120    Temp 98.6 F (37 C) (Oral)    Resp 18    Wt 78.3 kg    LMP 01/06/2021    SpO2 97%    BMI 28.72 kg/m   Lungs clear to P Abd gravid soft non tender  Pad none. Extr tr edema  Tracing: A) baseline 120 (+) accel  to 135 B)baseline 115-120 (+) accel to 135 -140 Irreg ctx   [place] MFM AMNIO (THERA) FLUID REDUCTION  Result Date: 10/05/2021 ----------------------------------------------------------------------  OBSTETRICS REPORT                    (Corrected Final 10/05/2021 07:02 pm) ---------------------------------------------------------------------- Patient Info  ID #:       [id number]                          D.O.B.:  1980-10-21 (40 yrs)  Name:       Felicia Baxter                  Visit Date: 10/05/2021 11:48 am ---------------------------------------------------------------------- Performed By  Attending:        Tama High MD        Ref. Address:      [facility]                                                              OB/GYN                                                              [address]                                                              [place]                                                              [place]  Performed By:     Wilnette Kales  [facility]                                  [facility]  Referred By:      Alanda Slim                    Aloria Looper MD ---------------------------------------------------------------------- Orders  #  Description                           Code        Ordered By  1  [place]  MFM OB LIMITED                     76815.01    [place]                                                       Miarose Lippert  2  [place] MFM AMNIO (THERA) FLUID            59001.0     Quinterious Walraven     REDUCTION-Twin A                                  Jizelle Conkey  61  [place] MFM OB TRANSVAGINAL                76817.2     Cuma Polyakov ----------------------------------------------------------------------  #  Order #                     Accession #                Episode #  1  [id number]                   [id number]                 [id number]  2  [id number]                   [id number]                 [id number]  3  [id number]                   [id number]                 [id number] ---------------------------------------------------------------------- Indications  Fetal anencephaly (Twin A)                      O35.0XX0 Q00.0  Advanced maternal age multigravida 39+,         O66.523  third trimester (41 y.o)  Twin pregnancy, mono/di, third trimester  O30.033  Fetal renal anomaly, fetus 2                    [id number]  Pregnancy resulting from assisted               O1.819  reproductive technology (IVF)  Previous cesarean delivery, antepartum          O109.219  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  Abnormal biochemical screen (AFP MoM            O28.9  11.73; OSBR 99991111)  2 vessel umbilical cord (A and B)               O69.89X0  Placenta previa specified as without            O44.03  hemorrhage, third trimester (Twin A)  Hypertension - Gestational                      O13.9  [redacted] weeks gestation of pregnancy                 Z3A.33 ---------------------------------------------------------------------- Fetal Evaluation (Fetus A)  Num Of Fetuses:          2  Fetal Heart Rate(bpm):   118  Cardiac Activity:        Observed  Presentation:            Cephalic  Placenta:                Posterior (No placenta previa)  Amniotic Fluid  AFI FV:      Polyhydramnios                               Largest Pocket(cm)                              12.5 ---------------------------------------------------------------------- OB History  Gravidity:    2         Term:   1        Prem:   0        SAB:   0  TOP:          0       Ectopic:  0        Living: 1 ---------------------------------------------------------------------- Gestational Age (Fetus A)  LMP:           38w 6d        Date:  01/06/21                 EDD:   10/13/21  Best:          33w 0d     Det. ByHenrine Screws Transfer          EDD:   11/23/21                                      (03/05/21) ---------------------------------------------------------------------- Anatomy (Fetus A)  Cranium:               Acrania/exencepha      Stomach:                Appears normal, left  ly                                                                        sided  Lips:                  Previously seen        Abdomen:                Previously seen  Palate:                Not well visualized    Abdominal Wall:         Previously seen  Thoracic:              Previously seen        Cord Vessels:           2 vessel cord,                                                                        absent right umb a  Heart:                 Previously seen; EIF   Kidneys:                Previously seen  RVOT:                  Previously seen        Bladder:                Appears normal  LVOT:                  Previously seen        Spine:                  Previously seen  Aortic Arch:           Previously seen        Upper Extremities:      Previously seen  Ductal Arch:           Previously seen        Lower Extremities:      Previously seen  Diaphragm:             Not well visualized ---------------------------------------------------------------------- Guided Procedures (Fetus A)  Type:   Amniocentesis  FH Pre Procedure:        119 bpm  FH Post Procedure:       NST  Rh Type:                 O positive  Rh Immunoglobulin:       Not required, Rh  positive  Discharge Instructions:  Post-procedure instructions given  Needle Insertions:       18 gauge x 2  Vol. Withdrawn:          1,300 milliliters  Catheter Passes:         2 passes  Transabdominal:  Yes  Complications:  None  Comment:        Informed consent was obtained. A "time-out" was                  performed before the procedure. Patient tolerated the                  procedure well. We gave her post-procedure instructions. ---------------------------------------------------------------------- Fetal Evaluation (Fetus B)  Num Of Fetuses:          2  Fetal Heart Rate(bpm):   113  Cardiac Activity:        Observed  Presentation:            Transverse, head to maternal right  Placenta:                Anterior  P. Cord Insertion:       Visualized, central  Amniotic Fluid  AFI FV:      Subjectively upper-normal                              Largest Pocket(cm)                              6.5 ---------------------------------------------------------------------- Gestational Age (Fetus B)  LMP:           38w 6d        Date:  01/06/21                 EDD:   10/13/21  Best:          33w 0d     Det. ByHenrine Screws Transfer          EDD:   11/23/21                                      (03/05/21) ---------------------------------------------------------------------- Anatomy (Fetus B)  Cranium:               Appears normal         LVOT:                   Previously seen  Cavum:                 Previously seen        Aortic Arch:            Not well visualized  Ventricles:            Bilat.ventriculo,      Ductal Arch:            Previously seen                         Rt=14  Lt= 12  Choroid Plexus:        Previously seen        Diaphragm:              Previously seen  Cerebellum:            Appears normal         Stomach:                Appears normal, left  sided  Posterior Fossa:       Appears normal         Abdomen:                Previously  seen  Nuchal Fold:           Previously seen        Abdominal Wall:         Previously seen  Face:                  Orbits and profile     Cord Vessels:           2 vessel cord,                         previously seen                                                                        absent left umb art  Lips:                  Previously seen        Kidneys:                Absent right                                                                        kidney, prev seen  Palate:                Not well visualized    Bladder:                Appears normal  Thoracic:              Appears normal         Spine:                  Previously seen  Heart:                 Previously seen        Upper Extremities:      Previously seen  RVOT:                  Previously seen        Lower Extremities:      Previously seen ---------------------------------------------------------------------- Cervix Uterus Adnexa  Cervix  Length:            0.3  cm.  Appears dilated, see comments. Measured transvaginally. ---------------------------------------------------------------------- Impression  Twin A: Cephalic presentation, posterior placenta.  Polyhydramnios is seen (maximum vertical pocket 13 cm).  Good fetal heart activity seen.  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  Twin B: Transverse lie and head to maternal right, anterior  placenta.  Amniotic fluid is normal.  Good fetal heart activity  seen.  Anatomical survey was not performed.  We performed transvaginal ultrasound to rule out placenta  previa.  The cervix was dilated and no  placenta previa was  seen.  After informed consent, under ultrasound guidance an 18-  gauge needle was inserted after giving local anesthesia with  lidocaine (3 mL).  The needle could not be placed into the  amniotic cavity because of the intervening intertwine  membrane and the needle was withdrawn.  Another place in  the maternal abdomen (left lateral) was chosen and 3 mL of  lidocaine was injected.  An  18-gauge needle was inserted  into the amniotic cavity of TWIN A under ultrasound guidance  and a total of 1,300 mL of clear amniotic fluid was withdrawn  (into 2 containers).  The procedure has to be stopped  because of maternal nausea and vomiting and the needle  was withdrawn.  Patient was later comfortable after the procedure.  Pre-and  postprocedure heart rate in twin B was normal.  Fetal heart  rate was checked in twin B during the procedure there was  normal.  Blood type: O positive.  xxxxxxxxxxxxxxxxxxxxxxxxxxx  Consultation (see EPIC )  I had the pleasure of seeing Ms. Oesterle today at the [facility]. She si G3 P1011 at 33-weeks' gestation and  was admitted today morning with c/o vaginal bleeding.  PPROM was suspected on the basis of positive amnisure test.  Patient had gone to [place] yesterday to be with her  parents for baby shower.  She had minimal vaginal bleeding  and was evaluated at a local hospital told her that they could  not provide the proper OB care for her.  Patient returned to  [facility] and was evaluated at the [facility].  She reports no gushing of amniotic fluid per vagina.  The  bleeding is minimal and is almost resolved.  Since admission, patient received first dose of  betamethasone and is on magnesium sulfate infusion.  She is  also getting IV ampicillin.  She has abdominal tightness and feels uncomfortable.  Patient has been under [facility] with [facility].  She has monochorionic-diamniotic twins  discordant for anencephaly.  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  Amniocentesis was performed at 19 weeks and twin B that  showed normal karyotype and MicroArray.  Significant  ultrasound findings in twin B include bilateral mild  ventriculomegaly, absent right kidney and single umbilical  artery.  On ultrasound performed 2 days ago, fetal growth was  appropriate for gestational age in twin B and the estimated  fetal weight was 2,007 g.  P/E; Patient is slightly  uncomfortable when lying down and  feels minimal uterine contractions.  BP 105/59, pulse 122/min, afebrile.  Abdomen: Skin feels stretched (polyhyramnios); no  tenderness.  NST: Baseline 120 bpm; reactive.  Labs: WBC 12.9, hemoglobin 11.7, hematocrit 35.6, platelets  185, blood type O positive.  Our concerns include:  Monochorionic-diamniotic twin pregnancy with  polyhydramnios in twin A with preterm labor  -I explained that preterm labor is due to polyhydramnios.  Maternal discomfort is because of polyhydramnios.  -I reassured the couple of normal placental position.  -Polyhydramnios and no measurable cervix on ultrasound or  significant findings and the likelihood of preterm delivery is  high.  -I discussed amnio reduction procedure that will provide  maternal comfort and possibly prolong the pregnancy.  Patient was told that the likelihood of preterm premature  rupture membranes or preterm delivery is higher and is  independent of the procedure performed.  In normal  circumstances, the risk of preterm delivery from the  procedure is 1 and 500  procedures.  Placental abruption can  rarely follow amnio reduction procedure.  -Alternatively, expectant management can continue as an  inpatient.  Patient informed that she would like to have amnioreduction  procedure that may relieve some discomfort she is  experiencing and also prolong the pregnancy.  She  understands the risk of preterm delivery.  Patient understands that emergency cesarean section may  be performed if fetal heart activity is reassuring. ---------------------------------------------------------------------- Recommendations  -Continue magnesium sulfate for 48 hours.  -Complete steroid course.  -NST as per protocol (magnesium sulfate infusion).  -Consent for cesarean delivery.  Watch for vaginal bleeding. ----------------------------------------------------------------------                       Tama High, MD Electronically Signed Corrected Final  Report  10/05/2021 07:02 pm ----------------------------------------------------------------------  [place] MFM OB Transvaginal  Result Date: 10/05/2021 ----------------------------------------------------------------------  OBSTETRICS REPORT                    (Corrected Final 10/05/2021 07:02 pm) ---------------------------------------------------------------------- Patient Info  ID #:       [id number]                          D.O.B.:  [birth year] (40 yrs)  Name:       Felicia Baxter                  Visit Date: 10/05/2021 11:48 am ---------------------------------------------------------------------- Performed By  Attending:        Tama High MD        Ref. Address:      [facility]                                                              OB/GYN                                                              [address]                                                              [place]                                                              [place]  Performed  By:     Wilnette Kales        [facility]                                  [facility]  Referred By:      Alanda Slim                    Sheriann Newmann MD ---------------------------------------------------------------------- Orders  #  Description                           Code        Ordered By  1  [place] MFM OB LIMITED                     [phone]    [place]                                                       Rhena Glace  2  [place] MFM AMNIO (THERA) FLUID            59001.0     Launa Goedken     REDUCTION-Twin A                                  Altin Sease  24  [place] MFM OB TRANSVAGINAL                [phone]     Channelle Bottger ----------------------------------------------------------------------  #  Order #                     Accession #                Episode #  1  [id number]                    [id number]                 [id number]  2  [id number]                   [id number]                 [id number]  3  [id number]                   [id number]                 [id number] ---------------------------------------------------------------------- Indications  Fetal anencephaly (Twin A)                      O35.0XX0 Q00.0  Advanced maternal age multigravida 3+,         O1.523  third trimester (41 y.o)  Twin pregnancy, mono/di, third trimester        O30.033  Fetal renal anomaly, fetus 2                    [id number]  Pregnancy resulting from assisted               O21.819  reproductive technology (IVF)  Previous cesarean delivery, antepartum          O78.219  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  Abnormal biochemical screen (AFP MoM            O28.9  11.73; OSBR 99991111)  2 vessel umbilical cord (A and B)               O69.89X0  Placenta previa specified as without            O44.03  hemorrhage, third trimester (Twin A)  Hypertension - Gestational                      O13.9  [redacted] weeks gestation of pregnancy                 Z3A.33 ---------------------------------------------------------------------- Fetal Evaluation (Fetus A)  Num Of Fetuses:          2  Fetal Heart Rate(bpm):   118  Cardiac Activity:        Observed  Presentation:            Cephalic  Placenta:                Posterior (No placenta previa)  Amniotic Fluid  AFI FV:      Polyhydramnios                              Largest Pocket(cm)                              12.5 ---------------------------------------------------------------------- OB History  Gravidity:    2         Term:   1        Prem:   0        SAB:   0  TOP:          0       Ectopic:  0        Living: 1 ---------------------------------------------------------------------- Gestational Age (Fetus A)  LMP:           38w 6d        Date:  01/06/21                 EDD:   10/13/21  Best:          33w 0d     Det. ByHenrine Screws Transfer          EDD:   11/23/21                                       (03/05/21) ---------------------------------------------------------------------- Anatomy (Fetus A)  Cranium:               Acrania/exencepha      Stomach:  Appears normal, left                         ly                                                                        sided  Lips:                  Previously seen        Abdomen:                Previously seen  Palate:                Not well visualized    Abdominal Wall:         Previously seen  Thoracic:              Previously seen        Cord Vessels:           2 vessel cord,                                                                        absent right umb a  Heart:                 Previously seen; EIF   Kidneys:                Previously seen  RVOT:                  Previously seen        Bladder:                Appears normal  LVOT:                  Previously seen        Spine:                  Previously seen  Aortic Arch:           Previously seen        Upper Extremities:      Previously seen  Ductal Arch:           Previously seen        Lower Extremities:      Previously seen  Diaphragm:             Not well visualized ---------------------------------------------------------------------- Guided Procedures (Fetus A)  Type:   Amniocentesis  FH Pre Procedure:        119 bpm  FH Post Procedure:       NST  Rh Type:                 O positive  Rh Immunoglobulin:       Not required, Rh positive  Discharge Instructions:  Post-procedure instructions given  Needle Insertions:       18 gauge x 2  Vol. Withdrawn:  1,300 milliliters  Catheter Passes:         2 passes  Transabdominal:          Yes  Complications:  None  Comment:        Informed consent was obtained. A "time-out" was                  performed before the procedure. Patient tolerated the                  procedure well. We gave her post-procedure instructions. ---------------------------------------------------------------------- Fetal Evaluation  (Fetus B)  Num Of Fetuses:          2  Fetal Heart Rate(bpm):   113  Cardiac Activity:        Observed  Presentation:            Transverse, head to maternal right  Placenta:                Anterior  P. Cord Insertion:       Visualized, central  Amniotic Fluid  AFI FV:      Subjectively upper-normal                              Largest Pocket(cm)                              6.5 ---------------------------------------------------------------------- Gestational Age (Fetus B)  LMP:           38w 6d        Date:  01/06/21                 EDD:   10/13/21  Best:          33w 0d     Det. ByHenrine Screws Transfer          EDD:   11/23/21                                      (03/05/21) ---------------------------------------------------------------------- Anatomy (Fetus B)  Cranium:               Appears normal         LVOT:                   Previously seen  Cavum:                 Previously seen        Aortic Arch:            Not well visualized  Ventricles:            Bilat.ventriculo,      Ductal Arch:            Previously seen                         Rt=14  Lt= 12  Choroid Plexus:        Previously seen        Diaphragm:              Previously seen  Cerebellum:            Appears normal         Stomach:  Appears normal, left                                                                        sided  Posterior Fossa:       Appears normal         Abdomen:                Previously seen  Nuchal Fold:           Previously seen        Abdominal Wall:         Previously seen  Face:                  Orbits and profile     Cord Vessels:           2 vessel cord,                         previously seen                                                                        absent left umb art  Lips:                  Previously seen        Kidneys:                Absent right                                                                        kidney, prev seen  Palate:                Not well visualized     Bladder:                Appears normal  Thoracic:              Appears normal         Spine:                  Previously seen  Heart:                 Previously seen        Upper Extremities:      Previously seen  RVOT:                  Previously seen        Lower Extremities:      Previously seen ---------------------------------------------------------------------- Cervix Uterus Adnexa  Cervix  Length:            0.3  cm.  Appears dilated, see comments. Measured transvaginally. ---------------------------------------------------------------------- Impression  Twin A: Cephalic presentation, posterior placenta.  Polyhydramnios is seen (maximum vertical pocket 13 cm).  Good fetal heart activity seen.  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  Twin B: Transverse lie and head to maternal right, anterior  placenta.  Amniotic fluid is normal.  Good fetal heart activity  seen.  Anatomical survey was not performed.  We performed transvaginal ultrasound to rule out placenta  previa.  The cervix was dilated and no placenta previa was  seen.  After informed consent, under ultrasound guidance an 18-  gauge needle was inserted after giving local anesthesia with  lidocaine (3 mL).  The needle could not be placed into the  amniotic cavity because of the intervening intertwine  membrane and the needle was withdrawn.  Another place in  the maternal abdomen (left lateral) was chosen and 3 mL of  lidocaine was injected.  An 18-gauge needle was inserted  into the amniotic cavity of TWIN A under ultrasound guidance  and a total of 1,300 mL of clear amniotic fluid was withdrawn  (into 2 containers).  The procedure has to be stopped  because of maternal nausea and vomiting and the needle  was withdrawn.  Patient was later comfortable after the procedure.  Pre-and  postprocedure heart rate in twin B was normal.  Fetal heart  rate was checked in twin B during the procedure there was  normal.  Blood type: O positive.  xxxxxxxxxxxxxxxxxxxxxxxxxxx   Consultation (see EPIC )  I had the pleasure of seeing Ms. Orengo today at the [facility]. She si G3 P1011 at 33-weeks' gestation and  was admitted today morning with c/o vaginal bleeding.  PPROM was suspected on the basis of positive amnisure test.  Patient had gone to [place] yesterday to be with her  parents for baby shower.  She had minimal vaginal bleeding  and was evaluated at a local hospital told her that they could  not provide the proper OB care for her.  Patient returned to  [facility] and was evaluated at the [facility].  She reports no gushing of amniotic fluid per vagina.  The  bleeding is minimal and is almost resolved.  Since admission, patient received first dose of  betamethasone and is on magnesium sulfate infusion.  She is  also getting IV ampicillin.  She has abdominal tightness and feels uncomfortable.  Patient has been under the joint care with [facility].  She has monochorionic-diamniotic twins  discordant for anencephaly.  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  Amniocentesis was performed at 19 weeks and twin B that  showed normal karyotype and MicroArray.  Significant  ultrasound findings in twin B include bilateral mild  ventriculomegaly, absent right kidney and single umbilical  artery.  On ultrasound performed 2 days ago, fetal growth was  appropriate for gestational age in twin B and the estimated  fetal weight was 2,007 g.  P/E; Patient is slightly uncomfortable when lying down and  feels minimal uterine contractions.  BP 105/59, pulse 122/min, afebrile.  Abdomen: Skin feels stretched (polyhyramnios); no  tenderness.  NST: Baseline 120 bpm; reactive.  Labs: WBC 12.9, hemoglobin 11.7, hematocrit 35.6, platelets  185, blood type O positive.  Our concerns include:  Monochorionic-diamniotic twin pregnancy with  polyhydramnios in twin A with preterm labor  -I explained that preterm labor is due to polyhydramnios.  Maternal discomfort is because of polyhydramnios.  -I  reassured the couple of normal placental position.  -Polyhydramnios and no measurable cervix on ultrasound  or  significant findings and the likelihood of preterm delivery is  high.  -I discussed amnio reduction procedure that will provide  maternal comfort and possibly prolong the pregnancy.  Patient was told that the likelihood of preterm premature  rupture membranes or preterm delivery is higher and is  independent of the procedure performed.  In normal  circumstances, the risk of preterm delivery from the  procedure is 1 and 500 procedures.  Placental abruption can  rarely follow amnio reduction procedure.  -Alternatively, expectant management can continue as an  inpatient.  Patient informed that she would like to have amnioreduction  procedure that may relieve some discomfort she is  experiencing and also prolong the pregnancy.  She  understands the risk of preterm delivery.  Patient understands that emergency cesarean section may  be performed if fetal heart activity is reassuring. ---------------------------------------------------------------------- Recommendations  -Continue magnesium sulfate for 48 hours.  -Complete steroid course.  -NST as per protocol (magnesium sulfate infusion).  -Consent for cesarean delivery.  Watch for vaginal bleeding. ----------------------------------------------------------------------                       Tama High, MD Electronically Signed Corrected Final Report  10/05/2021 07:02 pm ----------------------------------------------------------------------  [place] MFM OB LIMITED  Result Date: 10/05/2021 ----------------------------------------------------------------------  OBSTETRICS REPORT                    (Corrected Final 10/05/2021 07:02 pm) ---------------------------------------------------------------------- Patient Info  ID #:       [id number]                          D.O.B.:  July 30, 1981 (40 yrs)  Name:       Felicia Baxter                  Visit Date: 10/05/2021 11:48  am ---------------------------------------------------------------------- Performed By  Attending:        Tama High MD        Ref. Address:      Pam Drown                                                              OB/GYN                                                              [address] [phone]  [place]                                                              [place]  Performed By:     Wilnette Kales        [facility]  Referred By:      Alanda Slim                    Almeter Westhoff MD ---------------------------------------------------------------------- Orders  #  Description                           Code        Ordered By  1  [place] MFM OB LIMITED                     76815.01    [place]                                                       Iisha Soyars  2  [place] MFM AMNIO (THERA) FLUID            59001.0     [place]     REDUCTION-Twin A                                  Shahidah Nesbitt  67  [place] MFM OB TRANSVAGINAL                [phone]     Branch Pacitti ----------------------------------------------------------------------  #  Order #                     Accession #                Episode #  1  [id number]                   [id number]                 [id number]  2  [id number]                   [id number]                 [id number]  3  [id number]  [id number]                 [id number] ---------------------------------------------------------------------- Indications  Fetal anencephaly (Twin A)                      O35.0XX0 Q00.0  Advanced maternal age multigravida 38+,         O63.523  third trimester (41 y.o)  Twin pregnancy, mono/di, third trimester        O30.033  Fetal renal anomaly, fetus 2                     [id number]  Pregnancy resulting from assisted               O104.819  reproductive technology (IVF)  Previous cesarean delivery, antepartum          O41.219  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  Abnormal biochemical screen (AFP MoM            O28.9  11.73; OSBR [id number])  2 vessel umbilical cord (A and B)               O69.89X0  Placenta previa specified as without            O44.03  hemorrhage, third trimester (Twin A)  Hypertension - Gestational                      O13.9  [redacted] weeks gestation of pregnancy                 Z3A.33 ---------------------------------------------------------------------- Fetal Evaluation (Fetus A)  Num Of Fetuses:          2  Fetal Heart Rate(bpm):   118  Cardiac Activity:        Observed  Presentation:            Cephalic  Placenta:                Posterior (No placenta previa)  Amniotic Fluid  AFI FV:      Polyhydramnios                              Largest Pocket(cm)                              12.5 ---------------------------------------------------------------------- OB History  Gravidity:    2         Term:   1        Prem:   0        SAB:   0  TOP:          0       Ectopic:  0        Living: 1 ---------------------------------------------------------------------- Gestational Age (Fetus A)  LMP:           38w 6d        Date:  01/06/21                 EDD:   10/13/21  Best:          33w 0d     Det. ByHenrine Screws Transfer          EDD:   11/23/21                                      (  03/05/21) ---------------------------------------------------------------------- Anatomy (Fetus A)  Cranium:               Acrania/exencepha      Stomach:                Appears normal, left                         ly                                                                        sided  Lips:                  Previously seen        Abdomen:                Previously seen  Palate:                Not well visualized    Abdominal Wall:         Previously seen  Thoracic:               Previously seen        Cord Vessels:           2 vessel cord,                                                                        absent right umb a  Heart:                 Previously seen; EIF   Kidneys:                Previously seen  RVOT:                  Previously seen        Bladder:                Appears normal  LVOT:                  Previously seen        Spine:                  Previously seen  Aortic Arch:           Previously seen        Upper Extremities:      Previously seen  Ductal Arch:           Previously seen        Lower Extremities:      Previously seen  Diaphragm:             Not well visualized ---------------------------------------------------------------------- Guided Procedures (Fetus A)  Type:   Amniocentesis  FH Pre Procedure:        119 bpm  FH Post Procedure:       NST  Rh Type:  O positive  Rh Immunoglobulin:       Not required, Rh positive  Discharge Instructions:  Post-procedure instructions given  Needle Insertions:       18 gauge x 2  Vol. Withdrawn:          1,300 milliliters  Catheter Passes:         2 passes  Transabdominal:          Yes  Complications:  None  Comment:        Informed consent was obtained. A "time-out" was                  performed before the procedure. Patient tolerated the                  procedure well. We gave her post-procedure instructions. ---------------------------------------------------------------------- Fetal Evaluation (Fetus B)  Num Of Fetuses:          2  Fetal Heart Rate(bpm):   113  Cardiac Activity:        Observed  Presentation:            Transverse, head to maternal right  Placenta:                Anterior  P. Cord Insertion:       Visualized, central  Amniotic Fluid  AFI FV:      Subjectively upper-normal                              Largest Pocket(cm)                              6.5 ---------------------------------------------------------------------- Gestational Age (Fetus B)  LMP:           38w 6d        Date:   01/06/21                 EDD:   10/13/21  Best:          33w 0d     Det. ByHenrine Screws Transfer          EDD:   11/23/21                                      (03/05/21) ---------------------------------------------------------------------- Anatomy (Fetus B)  Cranium:               Appears normal         LVOT:                   Previously seen  Cavum:                 Previously seen        Aortic Arch:            Not well visualized  Ventricles:            Bilat.ventriculo,      Ductal Arch:            Previously seen                         Rt=14  Lt= 12  Choroid Plexus:        Previously seen        Diaphragm:  Previously seen  Cerebellum:            Appears normal         Stomach:                Appears normal, left                                                                        sided  Posterior Fossa:       Appears normal         Abdomen:                Previously seen  Nuchal Fold:           Previously seen        Abdominal Wall:         Previously seen  Face:                  Orbits and profile     Cord Vessels:           2 vessel cord,                         previously seen                                                                        absent left umb art  Lips:                  Previously seen        Kidneys:                Absent right                                                                        kidney, prev seen  Palate:                Not well visualized    Bladder:                Appears normal  Thoracic:              Appears normal         Spine:                  Previously seen  Heart:                 Previously seen        Upper Extremities:      Previously seen  RVOT:                  Previously seen        Lower  Extremities:      Previously seen ---------------------------------------------------------------------- Cervix Uterus Adnexa  Cervix  Length:            0.3  cm.  Appears dilated, see comments. Measured transvaginally.  ---------------------------------------------------------------------- Impression  Twin A: Cephalic presentation, posterior placenta.  Polyhydramnios is seen (maximum vertical pocket 13 cm).  Good fetal heart activity seen.  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  Twin B: Transverse lie and head to maternal right, anterior  placenta.  Amniotic fluid is normal.  Good fetal heart activity  seen.  Anatomical survey was not performed.  We performed transvaginal ultrasound to rule out placenta  previa.  The cervix was dilated and no placenta previa was  seen.  After informed consent, under ultrasound guidance an 18-  gauge needle was inserted after giving local anesthesia with  lidocaine (3 mL).  The needle could not be placed into the  amniotic cavity because of the intervening intertwine  membrane and the needle was withdrawn.  Another place in  the maternal abdomen (left lateral) was chosen and 3 mL of  lidocaine was injected.  An 18-gauge needle was inserted  into the amniotic cavity of TWIN A under ultrasound guidance  and a total of 1,300 mL of clear amniotic fluid was withdrawn  (into 2 containers).  The procedure has to be stopped  because of maternal nausea and vomiting and the needle  was withdrawn.  Patient was later comfortable after the procedure.  Pre-and  postprocedure heart rate in twin B was normal.  Fetal heart  rate was checked in twin B during the procedure there was  normal.  Blood type: O positive.  xxxxxxxxxxxxxxxxxxxxxxxxxxx  Consultation (see EPIC )  I had the pleasure of seeing Ms. Bracewell today at the [place] care. She si G3 P1011 at 33-weeks' gestation and  was admitted today morning with c/o vaginal bleeding.  PPROM was suspected on the basis of positive amnisure test.  Patient had gone to [place] yesterday to be with her  parents for baby shower.  She had minimal vaginal bleeding  and was evaluated at a local hospital told her that they could  not provide the proper OB care for her.  Patient  returned to  [facility] and was evaluated at the [facility].  She reports no gushing of amniotic fluid per vagina.  The  bleeding is minimal and is almost resolved.  Since admission, patient received first dose of  betamethasone and is on magnesium sulfate infusion.  She is  also getting IV ampicillin.  She has abdominal tightness and feels uncomfortable.  Patient has been under the joint care with [facility].  She has monochorionic-diamniotic twins  discordant for anencephaly.  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  Amniocentesis was performed at 19 weeks and twin B that  showed normal karyotype and MicroArray.  Significant  ultrasound findings in twin B include bilateral mild  ventriculomegaly, absent right kidney and single umbilical  artery.  On ultrasound performed 2 days ago, fetal growth was  appropriate for gestational age in twin B and the estimated  fetal weight was 2,007 g.  P/E; Patient is slightly uncomfortable when lying down and  feels minimal uterine contractions.  BP 105/59, pulse 122/min, afebrile.  Abdomen: Skin feels stretched (polyhyramnios); no  tenderness.  NST: Baseline 120 bpm; reactive.  Labs: WBC 12.9, hemoglobin 11.7, hematocrit 35.6, platelets  185, blood type O positive.  Our concerns include:  Monochorionic-diamniotic twin pregnancy with  polyhydramnios  in twin A with preterm labor  -I explained that preterm labor is due to polyhydramnios.  Maternal discomfort is because of polyhydramnios.  -I reassured the couple of normal placental position.  -Polyhydramnios and no measurable cervix on ultrasound or  significant findings and the likelihood of preterm delivery is  high.  -I discussed amnio reduction procedure that will provide  maternal comfort and possibly prolong the pregnancy.  Patient was told that the likelihood of preterm premature  rupture membranes or preterm delivery is higher and is  independent of the procedure performed.  In normal  circumstances, the risk of preterm  delivery from the  procedure is 1 and 500 procedures.  Placental abruption can  rarely follow amnio reduction procedure.  -Alternatively, expectant management can continue as an  inpatient.  Patient informed that she would like to have amnioreduction  procedure that may relieve some discomfort she is  experiencing and also prolong the pregnancy.  She  understands the risk of preterm delivery.  Patient understands that emergency cesarean section may  be performed if fetal heart activity is reassuring. ---------------------------------------------------------------------- Recommendations  -Continue magnesium sulfate for 48 hours.  -Complete steroid course.  -NST as per protocol (magnesium sulfate infusion).  -Consent for cesarean delivery.  Watch for vaginal bleeding. ----------------------------------------------------------------------                       Tama High, MD Electronically Signed Corrected Final Report  10/05/2021 07:02 pm ----------------------------------------------------------------------    Results for orders placed or performed during the hospital encounter of 10/04/21 (from the past 72 hour(s))  Urinalysis, Routine w reflex microscopic Urine, Clean Catch     Status: Abnormal   Collection Time: 10/05/21 12:40 AM  Result Value Ref Range   Color, Urine YELLOW YELLOW   APPearance CLOUDY (A) CLEAR   Specific Gravity, Urine 1.010 1.005 - 1.030   pH 5.0 5.0 - 8.0   Glucose, UA NEGATIVE NEGATIVE mg/dL   Hgb urine dipstick MODERATE (A) NEGATIVE   Bilirubin Urine NEGATIVE NEGATIVE   Ketones, ur NEGATIVE NEGATIVE mg/dL   Protein, ur 30 (A) NEGATIVE mg/dL   Nitrite NEGATIVE NEGATIVE   Leukocytes,Ua LARGE (A) NEGATIVE   RBC / HPF 6-10 0 - 5 RBC/hpf   WBC, UA >50 (H) 0 - 5 WBC/hpf   Bacteria, UA RARE (A) NONE SEEN   Squamous Epithelial / LPF 0-5 0 - 5   WBC Clumps PRESENT    Mucus PRESENT    Non Squamous Epithelial 0-5 (A) NONE SEEN    Comment: Performed at [facility],  [address] [place], [place] [address]  Amnisure rupture of membrane (rom)not at [facility]     Status: None   Collection Time: 10/05/21  1:20 AM  Result Value Ref Range   Amnisure ROM POSITIVE     Comment: Performed at [place] [address] [place], [place] [address]  RPR     Status: None   Collection Time: 10/05/21  1:20 AM  Result Value Ref Range   RPR Ser Ql NON REACTIVE NON REACTIVE    Comment: Performed at [facility], [place] [address] [place] [address]  CBC     Status: Abnormal   Collection Time: 10/05/21  1:20 AM  Result Value Ref Range   WBC 12.9 (H) 4.0 - 10.5 K/uL   RBC 4.23 3.87 - 5.11 MIL/uL   Hemoglobin 11.7 (L) 12.0 - 15.0 g/dL   HCT  35.6 (L) 36.0 - 46.0 %   MCV 84.2 80.0 - 100.0 fL   MCH 27.7 26.0 - 34.0 pg   MCHC 32.9 30.0 - 36.0 g/dL   RDW 13.5 11.5 - 15.5 %   Platelets 185 150 - 400 K/uL   nRBC 0.0 0.0 - 0.2 %    Comment: Performed at [facility], [place] [address] [place], [place] [address]  Type and screen [place]     Status: None   Collection Time: 10/05/21  1:20 AM  Result Value Ref Range   ABO/RH(D) O POS    Antibody Screen NEG    Sample Expiration      10/08/2021,2359 Performed at [facility], [place] [address] [place], [place] [address]   OB Urine Culture     Status: None (Preliminary result)   Collection Time: 10/05/21  4:40 AM   Specimen: Urine, Random  Result Value Ref Range   Specimen Description URINE, RANDOM    Special Requests NONE    Culture      CULTURE REINCUBATED FOR BETTER GROWTH Performed at [facility], [place] [address], [place], [place] [address]    Report Status PENDING     IMP: [place] Polyhydramnios twin A with anencephaly Bilateral ventriculomegaly twin B Placenta previa resolved  Unlikely PPROM( false positive amnisure) Twins @ 33 1/7 wk Previous Cesarean section IVF pregnancy AMA P)remain  inpt. D/c antibiotics Cont fetal monitoring

## 2021-10-07 ENCOUNTER — Inpatient Hospital Stay (HOSPITAL_COMMUNITY): Payer: BC Managed Care – PPO | Admitting: Anesthesiology

## 2021-10-07 ENCOUNTER — Encounter (HOSPITAL_COMMUNITY): Payer: Self-pay | Admitting: Obstetrics and Gynecology

## 2021-10-07 DIAGNOSIS — O1404 Mild to moderate pre-eclampsia, complicating childbirth: Secondary | ICD-10-CM | POA: Diagnosis present

## 2021-10-07 DIAGNOSIS — O30033 Twin pregnancy, monochorionic/diamniotic, third trimester: Secondary | ICD-10-CM | POA: Diagnosis present

## 2021-10-07 DIAGNOSIS — O3509X2 Maternal care for (suspected) other central nervous system malformation or damage in fetus, fetus 2: Secondary | ICD-10-CM | POA: Diagnosis present

## 2021-10-07 DIAGNOSIS — O34219 Maternal care for unspecified type scar from previous cesarean delivery: Secondary | ICD-10-CM | POA: Diagnosis present

## 2021-10-07 DIAGNOSIS — O4433 Partial placenta previa with hemorrhage, third trimester: Secondary | ICD-10-CM | POA: Diagnosis present

## 2021-10-07 DIAGNOSIS — O403XX2 Polyhydramnios, third trimester, fetus 2: Secondary | ICD-10-CM | POA: Diagnosis present

## 2021-10-07 DIAGNOSIS — O322XX2 Maternal care for transverse and oblique lie, fetus 2: Secondary | ICD-10-CM | POA: Diagnosis present

## 2021-10-07 DIAGNOSIS — O3502X1 Maternal care for (suspected) central nervous system malformation or damage in fetus, anencephaly, fetus 1: Secondary | ICD-10-CM | POA: Diagnosis present

## 2021-10-07 DIAGNOSIS — Z3A33 33 weeks gestation of pregnancy: Secondary | ICD-10-CM | POA: Diagnosis not present

## 2021-10-07 DIAGNOSIS — O42913 Preterm premature rupture of membranes, unspecified as to length of time between rupture and onset of labor, third trimester: Secondary | ICD-10-CM | POA: Diagnosis present

## 2021-10-07 LAB — COMPREHENSIVE METABOLIC PANEL
ALT: 10 U/L (ref 0–44)
AST: 23 U/L (ref 15–41)
Albumin: 2.4 g/dL — ABNORMAL LOW (ref 3.5–5.0)
Alkaline Phosphatase: 176 U/L — ABNORMAL HIGH (ref 38–126)
Anion gap: 12 (ref 5–15)
BUN: 8 mg/dL (ref 6–20)
CO2: 19 mmol/L — ABNORMAL LOW (ref 22–32)
Calcium: 8.4 mg/dL — ABNORMAL LOW (ref 8.9–10.3)
Chloride: 99 mmol/L (ref 98–111)
Creatinine, Ser: 1.02 mg/dL — ABNORMAL HIGH (ref 0.44–1.00)
GFR, Estimated: >60 mL/min (ref >60)
Glucose, Bld: 104 mg/dL — ABNORMAL HIGH (ref 70–99)
Potassium: 4 mmol/L (ref 3.5–5.1)
Sodium: 130 mmol/L — ABNORMAL LOW (ref 135–145)
Total Bilirubin: 0.6 mg/dL (ref 0.3–1.2)
Total Protein: 5.4 g/dL — ABNORMAL LOW (ref 6.5–8.1)

## 2021-10-07 LAB — CULTURE, BETA STREP (GROUP B ONLY)

## 2021-10-07 LAB — CBC
HCT: 29.9 % — ABNORMAL LOW (ref 36.0–46.0)
HCT: 27.6 % — ABNORMAL LOW (ref 36.0–46.0)
Hemoglobin: 9.4 g/dL — ABNORMAL LOW (ref 12.0–15.0)
Hemoglobin: 9.1 g/dL — ABNORMAL LOW (ref 12.0–15.0)
MCH: 26.9 pg (ref 26.0–34.0)
MCH: 27.8 pg (ref 26.0–34.0)
MCHC: 31.4 g/dL (ref 30.0–36.0)
MCHC: 33 g/dL (ref 30.0–36.0)
MCV: 85.4 fL (ref 80.0–100.0)
MCV: 84.4 fL (ref 80.0–100.0)
Platelets: 150 10*3/uL (ref 150–400)
Platelets: 148 10*3/uL — ABNORMAL LOW (ref 150–400)
RBC: 3.5 MIL/uL — ABNORMAL LOW (ref 3.87–5.11)
RBC: 3.27 MIL/uL — ABNORMAL LOW (ref 3.87–5.11)
RDW: 13.8 % (ref 11.5–15.5)
RDW: 13.5 % (ref 11.5–15.5)
WBC: 12.2 10*3/uL — ABNORMAL HIGH (ref 4.0–10.5)
WBC: 15.1 10*3/uL — ABNORMAL HIGH (ref 4.0–10.5)
nRBC: 0 % (ref 0.0–0.2)
nRBC: 0 % (ref 0.0–0.2)

## 2021-10-07 LAB — PROTEIN / CREATININE RATIO, URINE
Creatinine, Urine: 162.16 mg/dL
Protein Creatinine Ratio: 0.48 mg/mg{Cre} — ABNORMAL HIGH (ref 0.00–0.15)
Total Protein, Urine: 78 mg/dL

## 2021-10-07 LAB — ABO/RH: ABO/RH(D): O POS

## 2021-10-07 LAB — PREPARE RBC (CROSSMATCH)

## 2021-10-07 MED ORDER — PROMETHAZINE HCL 25 MG/ML IJ SOLN: 6.2500 mg | INTRAMUSCULAR | Status: DC | PRN

## 2021-10-07 MED ORDER — DIPHENHYDRAMINE HCL 50 MG/ML IJ SOLN
12.5000 mg | INTRAMUSCULAR | Status: DC | PRN
  Administered 2021-10-07: 12.5 mg via INTRAVENOUS
  Filled 2021-10-07: qty 1

## 2021-10-07 MED ORDER — NALOXONE HCL 4 MG/10ML IJ SOLN
1.0000 ug/kg/h | INTRAVENOUS | Status: DC | PRN
  Filled 2021-10-07: qty 5

## 2021-10-07 MED ORDER — SIMETHICONE 80 MG PO CHEW: 80.0000 mg | CHEWABLE_TABLET | ORAL | Status: DC | PRN

## 2021-10-07 MED ORDER — FUROSEMIDE 10 MG/ML IJ SOLN
40.0000 mg | Freq: Once | INTRAMUSCULAR | Status: AC
  Administered 2021-10-07: 40 mg via INTRAVENOUS
  Filled 2021-10-07: qty 4

## 2021-10-07 MED ORDER — TRANEXAMIC ACID-NACL 1000-0.7 MG/100ML-% IV SOLN
INTRAVENOUS | Status: AC
  Filled 2021-10-07: qty 100

## 2021-10-07 MED ORDER — TRANEXAMIC ACID-NACL 1000-0.7 MG/100ML-% IV SOLN
INTRAVENOUS | Status: DC | PRN
  Administered 2021-10-07: 1000 mg via INTRAVENOUS

## 2021-10-07 MED ORDER — MENTHOL 3 MG MT LOZG: 1.0000 | LOZENGE | OROMUCOSAL | Status: DC | PRN

## 2021-10-07 MED ORDER — MAGNESIUM SULFATE 40 GM/1000ML IV SOLN
INTRAVENOUS | Status: AC
  Filled 2021-10-07: qty 1000

## 2021-10-07 MED ORDER — LACTATED RINGERS IV SOLN: INTRAVENOUS | Status: DC

## 2021-10-07 MED ORDER — SCOPOLAMINE 1 MG/3DAYS TD PT72
MEDICATED_PATCH | TRANSDERMAL | Status: AC
  Filled 2021-10-07: qty 1

## 2021-10-07 MED ORDER — LACTATED RINGERS IV SOLN: INTRAVENOUS | Status: DC | PRN

## 2021-10-07 MED ORDER — ONDANSETRON HCL 4 MG/2ML IJ SOLN
INTRAMUSCULAR | Status: DC | PRN
Start: 2021-10-07 — End: 2021-10-07
  Administered 2021-10-07: 4 mg via INTRAVENOUS

## 2021-10-07 MED ORDER — MORPHINE SULFATE (PF) 0.5 MG/ML IJ SOLN
INTRAMUSCULAR | Status: DC | PRN
  Administered 2021-10-07: 150 ug via INTRATHECAL

## 2021-10-07 MED ORDER — CARBOPROST TROMETHAMINE 250 MCG/ML IM SOLN
INTRAMUSCULAR | Status: DC | PRN
  Administered 2021-10-07: 250 ug via INTRAMUSCULAR

## 2021-10-07 MED ORDER — KETOROLAC TROMETHAMINE 30 MG/ML IJ SOLN: 30.0000 mg | Freq: Four times a day (QID) | INTRAMUSCULAR | Status: AC | PRN

## 2021-10-07 MED ORDER — STERILE WATER FOR IRRIGATION IR SOLN
Status: DC | PRN
  Administered 2021-10-07: 1

## 2021-10-07 MED ORDER — ONDANSETRON HCL 4 MG/2ML IJ SOLN: 4.0000 mg | Freq: Three times a day (TID) | INTRAMUSCULAR | Status: DC | PRN

## 2021-10-07 MED ORDER — EPHEDRINE 5 MG/ML INJ
INTRAVENOUS | Status: AC
  Filled 2021-10-07: qty 5

## 2021-10-07 MED ORDER — DIBUCAINE (PERIANAL) 1 % EX OINT: 1.0000 "application " | TOPICAL_OINTMENT | CUTANEOUS | Status: DC | PRN

## 2021-10-07 MED ORDER — SODIUM CHLORIDE 0.9 % IV SOLN: 10.0000 mL/h | Freq: Once | INTRAVENOUS | Status: DC

## 2021-10-07 MED ORDER — DIPHENHYDRAMINE HCL 50 MG/ML IJ SOLN
INTRAMUSCULAR | Status: AC
  Filled 2021-10-07: qty 1

## 2021-10-07 MED ORDER — LACTATED RINGERS IV BOLUS: 1000.0000 mL | Freq: Once | INTRAVENOUS | Status: DC

## 2021-10-07 MED ORDER — OXYTOCIN-SODIUM CHLORIDE 30-0.9 UT/500ML-% IV SOLN: 2.5000 [IU]/h | INTRAVENOUS | Status: AC

## 2021-10-07 MED ORDER — ZOLPIDEM TARTRATE 5 MG PO TABS
5.0000 mg | ORAL_TABLET | Freq: Every evening | ORAL | Status: DC | PRN
  Administered 2021-10-08: 5 mg via ORAL
  Filled 2021-10-07: qty 1

## 2021-10-07 MED ORDER — MEPERIDINE HCL 25 MG/ML IJ SOLN: 6.2500 mg | INTRAMUSCULAR | Status: DC | PRN

## 2021-10-07 MED ORDER — NALOXONE HCL 0.4 MG/ML IJ SOLN: 0.4000 mg | INTRAMUSCULAR | Status: DC | PRN

## 2021-10-07 MED ORDER — CARBOPROST TROMETHAMINE 250 MCG/ML IM SOLN
INTRAMUSCULAR | Status: AC
  Filled 2021-10-07: qty 1

## 2021-10-07 MED ORDER — OXYTOCIN-SODIUM CHLORIDE 30-0.9 UT/500ML-% IV SOLN
INTRAVENOUS | Status: DC | PRN
  Administered 2021-10-07 (×2): 250 mL via INTRAVENOUS

## 2021-10-07 MED ORDER — PHENYLEPHRINE 40 MCG/ML (10ML) SYRINGE FOR IV PUSH (FOR BLOOD PRESSURE SUPPORT)
PREFILLED_SYRINGE | INTRAVENOUS | Status: DC | PRN
  Administered 2021-10-07: 80 ug via INTRAVENOUS

## 2021-10-07 MED ORDER — BUPIVACAINE IN DEXTROSE 0.75-8.25 % IT SOLN
INTRATHECAL | Status: DC | PRN
Start: 2021-10-07 — End: 2021-10-07
  Administered 2021-10-07: 1.6 mL via INTRATHECAL

## 2021-10-07 MED ORDER — SODIUM CHLORIDE 0.9% FLUSH: 3.0000 mL | INTRAVENOUS | Status: DC | PRN

## 2021-10-07 MED ORDER — CEFAZOLIN SODIUM-DEXTROSE 2-3 GM-%(50ML) IV SOLR
INTRAVENOUS | Status: DC | PRN
  Administered 2021-10-07: 2 g via INTRAVENOUS

## 2021-10-07 MED ORDER — MAGNESIUM SULFATE BOLUS VIA INFUSION
4.0000 g | Freq: Once | INTRAVENOUS | Status: AC
  Administered 2021-10-07: 4 g via INTRAVENOUS
  Filled 2021-10-07: qty 1000

## 2021-10-07 MED ORDER — OXYTOCIN 10 UNIT/ML IJ SOLN
INTRAMUSCULAR | Status: AC
  Filled 2021-10-07: qty 1

## 2021-10-07 MED ORDER — OXYCODONE HCL 5 MG PO TABS: 5.0000 mg | ORAL_TABLET | Freq: Once | ORAL | Status: DC | PRN

## 2021-10-07 MED ORDER — DEXAMETHASONE SODIUM PHOSPHATE 10 MG/ML IJ SOLN
INTRAMUSCULAR | Status: DC | PRN
  Administered 2021-10-07: 10 mg via INTRAVENOUS

## 2021-10-07 MED ORDER — PRENATAL MULTIVITAMIN CH
1.0000 | ORAL_TABLET | Freq: Every day | ORAL | Status: DC
  Administered 2021-10-07 – 2021-10-10 (×4): 1 via ORAL
  Filled 2021-10-07 (×4): qty 1

## 2021-10-07 MED ORDER — COCONUT OIL OIL
1.0000 "application " | TOPICAL_OIL | Status: DC | PRN
  Administered 2021-10-09: 1 via TOPICAL

## 2021-10-07 MED ORDER — HYDROMORPHONE HCL 1 MG/ML IJ SOLN: 0.2500 mg | INTRAMUSCULAR | Status: DC | PRN

## 2021-10-07 MED ORDER — OXYCODONE HCL 5 MG/5ML PO SOLN: 5.0000 mg | Freq: Once | ORAL | Status: DC | PRN

## 2021-10-07 MED ORDER — MISOPROSTOL 200 MCG PO TABS
800.0000 ug | ORAL_TABLET | Freq: Once | ORAL | Status: DC
  Administered 2021-10-07: 800 ug via RECTAL

## 2021-10-07 MED ORDER — HYDROCODONE-ACETAMINOPHEN 5-325 MG PO TABS
1.0000 | ORAL_TABLET | ORAL | Status: DC | PRN
  Administered 2021-10-07: 1 via ORAL
  Administered 2021-10-07 (×2): 2 via ORAL
  Filled 2021-10-07: qty 2
  Filled 2021-10-07: qty 1
  Filled 2021-10-07: qty 2

## 2021-10-07 MED ORDER — WITCH HAZEL-GLYCERIN EX PADS: 1.0000 "application " | MEDICATED_PAD | CUTANEOUS | Status: DC | PRN

## 2021-10-07 MED ORDER — DIPHENHYDRAMINE HCL 50 MG/ML IJ SOLN
INTRAMUSCULAR | Status: DC | PRN
  Administered 2021-10-07: 12.5 mg via INTRAVENOUS

## 2021-10-07 MED ORDER — EPHEDRINE SULFATE-NACL 50-0.9 MG/10ML-% IV SOSY
PREFILLED_SYRINGE | INTRAVENOUS | Status: DC | PRN
  Administered 2021-10-07 (×2): 5 mg via INTRAVENOUS

## 2021-10-07 MED ORDER — SCOPOLAMINE 1 MG/3DAYS TD PT72: 1.0000 | MEDICATED_PATCH | Freq: Once | TRANSDERMAL | Status: DC

## 2021-10-07 MED ORDER — FENTANYL CITRATE (PF) 100 MCG/2ML IJ SOLN
INTRAMUSCULAR | Status: DC | PRN
  Administered 2021-10-07: 15 ug via INTRATHECAL

## 2021-10-07 MED ORDER — SODIUM CHLORIDE 0.9 % IV SOLN: INTRAVENOUS | Status: DC

## 2021-10-07 MED ORDER — SENNOSIDES-DOCUSATE SODIUM 8.6-50 MG PO TABS
2.0000 | ORAL_TABLET | ORAL | Status: DC
  Administered 2021-10-09: 2 via ORAL
  Filled 2021-10-07 (×4): qty 2

## 2021-10-07 MED ORDER — SODIUM CHLORIDE 0.9 % IR SOLN
Status: DC | PRN
  Administered 2021-10-07: 1

## 2021-10-07 MED ORDER — ACETAMINOPHEN 10 MG/ML IV SOLN: 1000.0000 mg | Freq: Once | INTRAVENOUS | Status: DC | PRN

## 2021-10-07 MED ORDER — SIMETHICONE 80 MG PO CHEW
80.0000 mg | CHEWABLE_TABLET | Freq: Three times a day (TID) | ORAL | Status: DC
  Administered 2021-10-07 – 2021-10-10 (×7): 80 mg via ORAL
  Filled 2021-10-07 (×9): qty 1

## 2021-10-07 MED ORDER — MISOPROSTOL 200 MCG PO TABS
ORAL_TABLET | ORAL | Status: AC
  Filled 2021-10-07: qty 4

## 2021-10-07 MED ORDER — DIPHENHYDRAMINE HCL 25 MG PO CAPS: 25.0000 mg | ORAL_CAPSULE | ORAL | Status: DC | PRN

## 2021-10-07 MED ORDER — PHENYLEPHRINE HCL-NACL 20-0.9 MG/250ML-% IV SOLN
INTRAVENOUS | Status: DC | PRN
  Administered 2021-10-07: 60 ug/min via INTRAVENOUS

## 2021-10-07 MED ORDER — MAGNESIUM SULFATE 40 GM/1000ML IV SOLN
2.0000 g/h | INTRAVENOUS | Status: AC
  Administered 2021-10-07 (×2): 2 g/h via INTRAVENOUS
  Filled 2021-10-07: qty 1000

## 2021-10-07 MED ORDER — ACETAMINOPHEN 500 MG PO TABS
1000.0000 mg | ORAL_TABLET | Freq: Four times a day (QID) | ORAL | Status: DC
  Administered 2021-10-07 – 2021-10-10 (×12): 1000 mg via ORAL
  Filled 2021-10-07 (×12): qty 2

## 2021-10-07 MED ORDER — BUPIVACAINE HCL (PF) 0.25 % IJ SOLN
INTRAMUSCULAR | Status: DC | PRN
  Administered 2021-10-07: 10 mL

## 2021-10-07 MED ORDER — DIPHENHYDRAMINE HCL 25 MG PO CAPS: 25.0000 mg | ORAL_CAPSULE | Freq: Four times a day (QID) | ORAL | Status: DC | PRN

## 2021-10-07 MED ORDER — DEXAMETHASONE SODIUM PHOSPHATE 10 MG/ML IJ SOLN
INTRAMUSCULAR | Status: AC
  Filled 2021-10-07: qty 1

## 2021-10-07 NOTE — Op Note (Signed)
[name], Felicia Baxter MEDICAL RECORD NO: [id number] ACCOUNT NO: [id number] DATE OF BIRTH: 1981-08-10 FACILITY: [facility] LOCATION: [facility] PHYSICIAN: Felicia Buckles A. Cherly Hensen, MD  Operative Report   DATE OF PROCEDURE: 10/07/2021  PREOPERATIVE DIAGNOSES:  Unstoppable preterm labor, twin gestation at 47 and 1/7 weeks, anencephalic twin A, polyhydramnios twin A, bilateral ventriculomegaly twin B, IVF pregnancy and advanced maternal age and previous cesarean section.  PROCEDURE:  Repeat cesarean section, Kerr hysterotomy.  POSTOPERATIVE DIAGNOSES:  Unstoppable preterm labor, previous  cesarean section, twin gestation at 7 and 2/7 weeks, anencephalic twin A, polyhydramnios twin A, bilateral ventriculomegaly twin B, IVF pregnancy and advanced maternal age, previous cesarean section.  ANESTHESIA:  Spinal.  SURGEON:  Maxie Better, MD  ASSISTANT:  None.  DESCRIPTION OF PROCEDURE:  Under adequate spinal anesthesia, the patient was placed in the supine position with a left lateral tilt.  She was sterilely prepped and draped in the usual fashion.  An indwelling Foley catheter was sterilely placed.  0.25%  Marcaine was injected along the previous Pfannenstiel skin incision site.  Pfannenstiel skin incision was then made, carried down to the rectus fascia.  The rectus fascia was opened transversely.  Rectus fascia was then bluntly and sharply dissected off  the rectus muscle in a superior and inferior fashion and during the dissection superiorly, the separation of the rectus muscle was noted and the parietal peritoneum was seen.  Careful dissection was continued and with blunt dissection, the parietal  peritoneum was opened and then extended superiorly and inferiorly.  On entering the abdominal cavity, there was dark red blood noted.  An Alexis retractor was then placed.  Vesicouterine peritoneum was opened transversely.  A small curvilinear incision  was made and extended bluntly in the cephalic and caudad  fashion. Ballooning of the amniotic sac containing a large amount of fluid consistent with polyhydramnios was encountered.  Gentle delivery of an anencephalic female from the occiput posterior  position was delivered with cord around the neck and body. Subsequently, twin B, the  fetal parts were identified through the intact amniotic sac.  The baby was in transverse back down position with head to the maternal right.  The baby was converted to a breech, with subsequent breech maneuvers used to deliver also a live female in the usual manner.  Nuchal cord and body cord was also noted on second baby as well.  That  amniotic fluid volume was normal.  Both babies were transferred to the awaiting pediatricians who assigned Apgars of 1 and 1 (twin A)at one and five minutes respectively and Apgars 3 and 8 at one and five minutes respectively for twin B  The Cell Saver was used during the case.  The placenta x 2 was manually removed.  It partially felt adherent posteriorly.  The uterus was exteriorized.  No evidence of rupture or defect to explain the original bleeding that is seen on entering the cavity. The uterine cavity was continued to be manually digitally palpated.  Additional tissue was removed until the cavity was felt to be complete of retained tissue.  Uterus was atonic, uterus was returned back to the abdomen.  Normal tubes and ovaries were noted bilaterally.  The patient received TXA, IM prostaglandin and at the end of the case, Cytotec 800 mcg rectally for uterine at  The uterine incision had no extension.  The uterine incision was closed in two layers, the first 0 Monocryl running locked stitch, second layer was imbricated using 0 Vicryl suture.  Massaging of the uterus  was continued.  The abdomen was  irrigated and suctioned.  Interceed was placed in inverted T fashion overlying that incision.  The Jon Gills was removed.  The parietal peritoneum was then closed with 2-0 Vicryl.  The rectus fascia was closed  with 0 Vicryl x2.  The subcutaneous area was  irrigated, suctioned of debris.  Small bleeders cauterized.  Interrupted 2-0 plain suture was placed and the skin approximated using 4-0 Monocryl subcuticular closure.  Steri-Strips and benzoin was placed with the honeycomb dressing.  SPECIMEN:  Placenta x2, to pathology.  ESTIMATED BLOOD LOSS: 1571 mL.  INTRAOPERATIVE FLUID: 1 liter LR and 350 mL of Cell Saver.  URINE OUTPUT: 150 mL.  COMPLICATIONS:  None.  The patient tolerated the procedure well and was transferred to recovery room in stable condition.  The first twin had passed while still in the operating room and the other baby went to the NICU for prematurity.   SHW D: 10/07/2021 3:34:09 am T: 10/07/2021 7:52:00 am  JOB: [id number]/ [id number]

## 2021-10-07 NOTE — Progress Notes (Signed)
Chaplain met with Felicia Baxter at [employer]. Chaplain asked open ended questions to facilitate emotional expression and story telling. Felicia Baxter shared about her difficult symptoms over the weekend and the need to deliver her girls early this morning. Felicia Baxter shared that her primary priority was to see her daughter before she died and she was grateful that she was able to do so. Chaplain offered reflective listening and support. Felicia Baxter and Felicia Baxter are considering cremation and have some concerns about how Felicia Baxter's mother will receive this information because no one in her family has done a cremation before. Chaplain offered support and counsel as Felicia Baxter explored how to set boundaries with her family and work. Will continue to follow.  Please page as further needs arise.  Donald Prose. Elyn Peers, M.Div. [facility] Chaplain Pager [phone] Office [phone]

## 2021-10-07 NOTE — Anesthesia Postprocedure Evaluation (Signed)
Anesthesia Post Note  Patient: Scientific laboratory technician  Procedure(s) Performed: CESAREAN SECTION     Patient location during evaluation: PACU Anesthesia Type: Spinal Level of consciousness: oriented and awake and alert Pain management: pain level controlled Vital Signs Assessment: post-procedure vital signs reviewed and stable Respiratory status: spontaneous breathing, respiratory function stable and nonlabored ventilation Cardiovascular status: blood pressure returned to baseline and stable Postop Assessment: no headache, no backache, no apparent nausea or vomiting and spinal receding Anesthetic complications: no   No notable events documented.  Last Vitals:  Vitals:   10/07/21 0318 10/07/21 0330  BP:  (!) 152/94  Pulse: (!) 114 (!) 109  Resp: 20 16  Temp:    SpO2:  100%    Last Pain:  Vitals:   10/07/21 0330  TempSrc:   PainSc: 0-No pain   Pain Goal: Patients Stated Pain Goal: 2 (10/06/21 2000)  LLE Motor Response: Purposeful movement (10/07/21 0330) LLE Sensation: Tingling (10/07/21 0330) RLE Motor Response: Purposeful movement (10/07/21 0330) RLE Sensation: Tingling (10/07/21 0330)     Epidural/Spinal Function Cutaneous sensation: Tingles (10/07/21 0330), Patient able to flex knees: Yes (10/07/21 0330), Patient able to lift hips off bed: No (10/07/21 0330), Back pain beyond tenderness at insertion site: No (10/07/21 0330), Progressively worsening motor and/or sensory loss: No (10/07/21 0330), Bowel and/or bladder incontinence post epidural: No (10/07/21 0330)  Felicia Baxter

## 2021-10-07 NOTE — Brief Op Note (Signed)
10/07/2021  2:15 AM  PATIENT:  Felicia Baxter  41 y.o. female  PRE-OPERATIVE DIAGNOSIS:  Unstoppable preterm labor,  Previous Cesarean section,  twin gestation @ 19 + wk,  anencephalic twin A, polyhydramnios twin A, twin B ventriculomegaly, IVF pregnancy, AMA,  POST-OPERATIVE DIAGNOSIS:  same, mild preeclampsia  PROCEDURE:  repeat Cesarean section, kerr hysterotomy  SURGEON:  Surgeon(s) and Role:    * Alicea Wente, Alanda Slim, MD - Primary  PHYSICIAN ASSISTANT:   ASSISTANTS: none   ANESTHESIA:   spinal FINDINGS; live anencephalic female OP with nuchal and body cord, pink amniotic fluid ( polyhydramnios), twin A Twin B transverse lie back down maternal right  live female delivered as breech, nuchal and body cord, nl tubes and ovaries Adherent twin placenta EBL:  1000 mL   BLOOD ADMINISTERED: 350 CC CELLSAVER  DRAINS: none   LOCAL MEDICATIONS USED:  MARCAINE     SPECIMEN:  Source of Specimen:  placenta x 2  DISPOSITION OF SPECIMEN:  PATHOLOGY  COUNTS:  YES  TOURNIQUET:  * No tourniquets in log *  DICTATION: .Other Dictation: Dictation Number [id number]  PLAN OF CARE: Admit to inpatient   PATIENT DISPOSITION:  PACU - hemodynamically stable.   Delay start of Pharmacological VTE agent (>24hrs) due to surgical blood loss or risk of bleeding: no

## 2021-10-07 NOTE — Anesthesia Procedure Notes (Signed)
Spinal ° °Patient location during procedure: OR °Reason for block: surgical anesthesia °Staffing °Performed: anesthesiologist  °Anesthesiologist: Maycen Degregory E, MD °Preanesthetic Checklist °Completed: patient identified, IV checked, risks and benefits discussed, surgical consent, monitors and equipment checked, pre-op evaluation and timeout performed °Spinal Block °Patient position: sitting °Prep: DuraPrep and site prepped and draped °Patient monitoring: continuous pulse ox, blood pressure and heart rate °Approach: midline °Location: L3-4 °Injection technique: single-shot °Needle °Needle type: Pencan  °Needle gauge: 24 G °Needle length: 10 cm °Assessment °Events: CSF return °Additional Notes °Functioning IV was confirmed and monitors were applied. Sterile prep and drape, including hand hygiene and sterile gloves were used. The patient was positioned and the spine was prepped. The skin was anesthetized with lidocaine.  Free flow of clear CSF was obtained prior to injecting local anesthetic into the CSF. The needle was carefully withdrawn. The patient tolerated the procedure well.  ° ° ° °

## 2021-10-07 NOTE — Lactation Note (Signed)
This note was copied from a baby's chart.  NICU Lactation Consultation Note  Patient Name: Felicia Baxter [id number] Date: 10/07/2021 Age:42 hours   Subjective Reason for consult: Initial assessment; NICU baby Mother has a 3 yo child that was not bf. She recalls breast changes following that pregnancy. Mother had breast augmentation for cosmetic purposes. She recalls breast symmetry prior to surgery. She denies other significant breast history.  We reviewed possibility of delayed transition to copious milk because of pph. LC assisted with initiation of pumping during visit.   Mother requests a referral for stork pump. RN to place order.  Objective Infant data: Mother's Current Feeding Choice: Breast Milk and Formula   Maternal data: [id number]  C-Section, Low Transverse Significant Breast History:: hx breast augmentation  Does the patient have breastfeeding experience prior to this delivery?: No  Risk factor for low milk supply:: pph with blood loss, AMA, hx pre-E    Assessment Infant: Feeding Status: NPO  Maternal: Transition to copious milk may be delayed.   Intervention/Plan Interventions: Education; "The NICU and Your Baby" book; Infant Driven Feeding Algorithm education; Hand express; Breast massage; Hand pump  Tools: Pump Pump Education: Setup, frequency, and cleaning; Milk Storage  Plan: Consult Status: Follow-up (verify receipt of stork pump)  NICU Follow-up type: New admission follow up; Verify onset of copious milk; Verify absence of engorgement; Maternal D/C visit  Mother to pump q3 and bring any EBM to NICU.  Elder Negus 10/07/2021, 10:20 AM

## 2021-10-07 NOTE — Transfer of Care (Signed)
Immediate Anesthesia Transfer of Care Note  Patient: Felicia Baxter  Procedure(s) Performed: CESAREAN SECTION  Patient Location: PACU  Anesthesia Type:Spinal  Level of Consciousness: awake  Airway & Oxygen Therapy: Patient Spontanous Breathing  Post-op Assessment: Report given to RN and Post -op Vital signs reviewed and stable  Post vital signs: Reviewed and stable  Last Vitals:  Vitals Value Taken Time  BP 110/73 10/07/21 0230  Temp    Pulse 108 10/07/21 0235  Resp 19 10/07/21 0235  SpO2 100 % 10/07/21 0235  Vitals shown include unvalidated device data.  Last Pain:  Vitals:   10/07/21 0014  TempSrc:   PainSc: 8       Patients Stated Pain Goal: 2 (10/06/21 2000)  Complications: No notable events documented.

## 2021-10-07 NOTE — Progress Notes (Signed)
SVD: repeat  S:  Pt reports feeling well/ Tolerating po/ Voiding without problems/ No n/v/ Bleeding is light/ Pain controlled withacetaminophen and narcotic analgesics including oxycodone/acetaminophen (Percocet, Tylox), coping well Twin A passed. Twin B in [place]. Planning funeral arrangement    O:  A & O x 3 well/ VS: Blood pressure 128/85, pulse (!) 114, temperature 98.3 F (36.8 C), temperature source Oral, resp. rate 16, weight 78.3 kg, last menstrual period 01/06/2021, SpO2 98 %, unknown if currently breastfeeding.  LABS:  Results for orders placed or performed during the hospital encounter of 10/04/21 (from the past 24 hour(s))  Comprehensive metabolic panel     Status: Abnormal   Collection Time: 10/06/21 11:41 PM  Result Value Ref Range   Sodium 130 (L) 135 - 145 mmol/L   Potassium 4.0 3.5 - 5.1 mmol/L   Chloride 99 98 - 111 mmol/L   CO2 19 (L) 22 - 32 mmol/L   Glucose, Bld 104 (H) 70 - 99 mg/dL   BUN 8 6 - 20 mg/dL   Creatinine, Ser 2.44 (H) 0.44 - 1.00 mg/dL   Calcium 8.4 (L) 8.9 - 10.3 mg/dL   Total Protein 5.4 (L) 6.5 - 8.1 g/dL   Albumin 2.4 (L) 3.5 - 5.0 g/dL   AST 23 15 - 41 U/L   ALT 10 0 - 44 U/L   Alkaline Phosphatase 176 (H) 38 - 126 U/L   Total Bilirubin 0.6 0.3 - 1.2 mg/dL   GFR, Estimated >01 >02 mL/min   Anion gap 12 5 - 15  Protein / creatinine ratio, urine     Status: Abnormal   Collection Time: 10/06/21 11:50 PM  Result Value Ref Range   Creatinine, Urine 162.16 mg/dL   Total Protein, Urine 78 mg/dL   Protein Creatinine Ratio 0.48 (H) 0.00 - 0.15 mg/mg[Cre]  Prepare RBC (crossmatch)     Status: None   Collection Time: 10/07/21 12:17 AM  Result Value Ref Range   Order Confirmation      ORDER PROCESSED BY BLOOD BANK Performed at [facility], [address], [place], [place] [address]   CBC     Status: Abnormal   Collection Time: 10/07/21  3:15 AM  Result Value Ref Range   WBC 12.2 (H) 4.0 - 10.5 K/uL   RBC 3.50 (L) 3.87 - 5.11 MIL/uL    Hemoglobin 9.4 (L) 12.0 - 15.0 g/dL   HCT 64.4 (L) 03.4 - 74.2 %   MCV 85.4 80.0 - 100.0 fL   MCH 26.9 26.0 - 34.0 pg   MCHC 31.4 30.0 - 36.0 g/dL   RDW 59.5 63.8 - 75.6 %   Platelets 150 150 - 400 K/uL   nRBC 0.0 0.0 - 0.2 %  CBC     Status: Abnormal   Collection Time: 10/07/21  5:01 AM  Result Value Ref Range   WBC 15.1 (H) 4.0 - 10.5 K/uL   RBC 3.27 (L) 3.87 - 5.11 MIL/uL   Hemoglobin 9.1 (L) 12.0 - 15.0 g/dL   HCT 43.3 (L) 29.5 - 18.8 %   MCV 84.4 80.0 - 100.0 fL   MCH 27.8 26.0 - 34.0 pg   MCHC 33.0 30.0 - 36.0 g/dL   RDW 41.6 60.6 - 30.1 %   Platelets 148 (L) 150 - 400 K/uL   nRBC 0.0 0.0 - 0.2 %    I&O: I/O last 3 completed shifts: In: 4404.7 [P.O.:1380; I.V.:1974.7; Blood:350; IV Piggyback:700] Out: 3416 [Urine:1765; Other:130; Blood:1521]   Total  I/O In: 1540.6 [P.O.:120; I.V.:1420.6] Out: 5300 [Urine:5300]  Lungs: chest clear, no wheezing, rales, normal symmetric air entry  Heart: regular rate and rhythm, S1, S2 normal, no murmur, click, rub or gallop  Abdomen: soft uterus at umb, firm  Incision: primary dressing d/c/i  Perineum: not inspected  Lochia: light  Extremities:no redness or tenderness in the calves or thighs, edema 2+    A/P: POD # 1/PPD # 9/ [id number] s/p repeat C/S Mild preeclampsia on magnesium sulfate Grieving appropriately for loss of twin A  Doing well  Continue routine post partum orders  Cont magnesium for 24 hrs total Lasix to help with peripheral edema

## 2021-10-08 MED ORDER — IBUPROFEN 600 MG PO TABS
600.0000 mg | ORAL_TABLET | Freq: Four times a day (QID) | ORAL | Status: DC | PRN
  Administered 2021-10-08 – 2021-10-09 (×2): 600 mg via ORAL
  Filled 2021-10-08 (×3): qty 1

## 2021-10-08 MED ORDER — FUROSEMIDE 10 MG/ML IJ SOLN
40.0000 mg | Freq: Once | INTRAMUSCULAR | Status: AC
  Administered 2021-10-08: 40 mg via INTRAVENOUS
  Filled 2021-10-08: qty 4

## 2021-10-08 MED FILL — Sodium Chloride IV Soln 0.9%: INTRAVENOUS | Qty: 1000 | Status: AC

## 2021-10-08 MED FILL — Heparin Sodium (Porcine) Inj 1000 Unit/ML: INTRAMUSCULAR | Qty: 30 | Status: AC

## 2021-10-08 NOTE — Progress Notes (Signed)
SVD: repeat  S:  Pt reports feeling better but notes some incisional pain on the left side/ Tolerating po/ Voiding without problems/ No n/v/ Bleeding is just changed pad/ Pain controlled withprescription NSAID's including motrin and narcotic analgesics including oxycodone (Oxycontin, Oxyir)    O:  A & O x 3 / VS: Blood pressure (!) 109/57, pulse 86, temperature 98.1 F (36.7 C), temperature source Oral, resp. rate 18, weight 78.3 kg, last menstrual period 01/06/2021, SpO2 100 %, unknown if currently breastfeeding.  LABS: No results found for this or any previous visit (from the past 24 hour(s)).  I&O: I/O last 3 completed shifts: In: 4403.9 [P.O.:960; I.V.:2993.9; Blood:350; IV Piggyback:100] Out: [id number] [Urine:12115; Other:130; Blood:1521]   Total I/O In: -  Out: 500 [Urine:500]  Lungs: chest clear, no wheezing, rales, normal symmetric air entry  Heart: regular rate and rhythm, S1, S2 normal, no murmur, click, rub or gallop  Abdomen: soft  uterus @ 1 FB above umb Primary d/c/i  Perineum: not inspected  Lochia: just changed  Extremities:no redness or tenderness in the calves or thighs, edema 1+    A/P: POD # 2/PPD # 2/ [id number] Mild preeclampsia s/p magnesium sulfate Peripheral edema  Doing well  Continue routine post partum orders Repeat lasix dose Anticipate d/c in am

## 2021-10-08 NOTE — Plan of Care (Signed)
°  Problem: Education: Goal: Knowledge of General Education information will improve Description: Including pain rating scale, medication(s)/side effects and non-pharmacologic comfort measures Outcome: Progressing   Problem: Health Behavior/Discharge Planning: Goal: Ability to manage health-related needs will improve Outcome: Progressing   Problem: Clinical Measurements: Goal: Ability to maintain clinical measurements within normal limits will improve Outcome: Progressing Goal: Will remain free from infection Outcome: Progressing Goal: Cardiovascular complication will be avoided Outcome: Progressing   Problem: Activity: Goal: Risk for activity intolerance will decrease Outcome: Progressing   Problem: Nutrition: Goal: Adequate nutrition will be maintained Outcome: Progressing   Problem: Coping: Goal: Level of anxiety will decrease Outcome: Progressing   Problem: Elimination: Goal: Will not experience complications related to bowel motility Outcome: Progressing Goal: Will not experience complications related to urinary retention Outcome: Progressing   Problem: Pain Managment: Goal: General experience of comfort will improve Outcome: Progressing   Problem: Safety: Goal: Ability to remain free from injury will improve Outcome: Progressing   Problem: Skin Integrity: Goal: Risk for impaired skin integrity will decrease Outcome: Progressing   Problem: Education: Goal: Knowledge of disease or condition will improve Outcome: Progressing Goal: Knowledge of the prescribed therapeutic regimen will improve Outcome: Progressing Goal: Individualized Educational Video(s) Outcome: Progressing   Problem: Clinical Measurements: Goal: Complications related to the disease process, condition or treatment will be avoided or minimized Outcome: Progressing

## 2021-10-08 NOTE — Progress Notes (Signed)
Follow up visit with Luis and her mother as well as her young adult daughter. Chaplain provided brief non-clinical update on visit with baby in NICU. Zanyah reports she is feeling better physically today, but still experiencing a lot of pain in one area. She was excited to hear that baby is alert.  Baby Lucianne Lei was out of the room when I visited. Cody's mother is having difficulty being in her presence. The family requested I assist them with a funeral tomorrow before the grandparents leave town. Chaplain reserved the chapel for a 2:00 service and prepared materials for the family to review for further discussion tomorrow. Provided further grief support and education as well.  Please page as further needs arise.  Maryanna Shape. Carley Hammed, M.Div. [facility] Chaplain Pager [phone] Office [phone]

## 2021-10-08 NOTE — Lactation Note (Signed)
This note was copied from a baby's chart. Lactation Consultation Note  Patient Name: Felicia Baxter [id number] Date: 10/08/2021 Reason for consult: Follow-up assessment;NICU baby;Preterm <34wks;Infant < 6lbs;Multiple gestation;Other (Comment);Breast augmentation (AMA) Age:42 hours  Visited with Mom of 92 hours old, preterm surviving NICU twin female. She reports she has not been pumping consistently. Explained to mom the importance of consistent pumping for the onset of lactogenesis II. Mom reports having a breast augmentation in 2007, after having her first child who is now 91 years old. Educated Mom on the differences between wearable pumps, hospital grade pumps and full sized pumps. She wanted to get a wearable pump through her insurance, but explained to her that the best pumps for establishing supply are hospital grade and full sized pumps.   Maternal Data  Mom's supply is WNL.   Feeding Mother's Current Feeding Choice: Breast Milk and Donor Milk  Lactation Tools Discussed/Used Tools: Pump;Flanges Flange Size: 24 Breast pump type: Double-Electric Breast Pump Pump Education: Setup, frequency, and cleaning;Milk Storage Reason for Pumping: Preterm in NICU Pumping frequency: 1-2 times in 24 hours Pumped volume: 4 mL  Interventions Interventions: Breast feeding basics reviewed;Hand express;DEBP;Education  Plan of Care:   1) Encouraged Mom to pump every 3 hours, at least 8 times in 24 hours. 2) RN Crystal will place an order for a Stork Pump.  All questions and concerns answered. Encouraged Mom to call NICU LC PRN.  Discharge Pump: DEBP [facility] Specialty Care RN Crystal will place an order for a Stork Pump) WIC Program: No  Consult Status Consult Status: Follow-up Date: 10/08/21 Follow-up type: In-patient    Hessie Dibble 10/08/2021, 3:16 PM

## 2021-10-09 LAB — TYPE AND SCREEN
ABO/RH(D): O POS
Antibody Screen: NEGATIVE
Unit division: 0
Unit division: 0

## 2021-10-09 LAB — BPAM RBC
Blood Product Expiration Date: 202302202359
Blood Product Expiration Date: 202302202359
ISSUE DATE / TIME: 202301300027
ISSUE DATE / TIME: 202301300027
Unit Type and Rh: 5100
Unit Type and Rh: 5100

## 2021-10-09 MED ORDER — IBUPROFEN 600 MG PO TABS
600.0000 mg | ORAL_TABLET | Freq: Four times a day (QID) | ORAL | Status: DC
  Administered 2021-10-09 – 2021-10-10 (×5): 600 mg via ORAL
  Filled 2021-10-09 (×5): qty 1

## 2021-10-09 MED ORDER — OXYCODONE HCL 5 MG PO TABS: 5.0000 mg | ORAL_TABLET | ORAL | Status: DC | PRN

## 2021-10-09 MED ORDER — OXYCODONE HCL 5 MG PO TABS
5.0000 mg | ORAL_TABLET | ORAL | Status: DC | PRN
  Filled 2021-10-09: qty 2

## 2021-10-09 MED ORDER — HYDROMORPHONE HCL 2 MG PO TABS
4.0000 mg | ORAL_TABLET | ORAL | Status: DC | PRN
  Administered 2021-10-09 – 2021-10-10 (×4): 4 mg via ORAL
  Filled 2021-10-09 (×4): qty 2

## 2021-10-09 NOTE — Lactation Note (Signed)
This note was copied from a baby's chart.  NICU Lactation Consultation Note  Patient Name: Felicia Baxter [id number] Date: 10/09/2021 Age:42 hours   Subjective Reason for consult: Follow-up assessment Mother has increased her frequency of pumping. She pumped during the night and pumped this morning prior to my arrival. Mother is experiencing slight breast changes to but denies discomfort.   Mother is unsure if she will d/c today. We reviewed s/s of engorgement and strategies to prevent and treat prn. Mother received a stork pump yesterday.  Objective Infant data: Mother's Current Feeding Choice: Breast Milk and Donor Milk  Infant feeding assessment Scale for Readiness: 2    Maternal data: [id number]  C-Section, Low Transverse  Pumping frequency: 1-2 times in 24 hours Pumped volume: 4 mL Flange Size: 24   WIC Program: No Pump: DEBP ([facility] Specialty Care RN Crystal will place an order for a Stork Pump)  Assessment Maternal: Milk volume: Normal  Mother is at-risk for engorgement because of hx breast augmentation and infrequent early pumping.  Intervention/Plan Interventions: Education  Tools: Pump; Flanges Pump Education: Setup, frequency, and cleaning; Milk Storage  Plan: Consult Status: Follow-up  NICU Follow-up type: Verify absence of engorgement; Verify onset of copious milk; Maternal D/C visit  Pump q3 and bring any EBM to NICU.   Elder Negus 10/09/2021, 9:49 AM

## 2021-10-09 NOTE — Progress Notes (Signed)
SVD: repeat  S:  Pt reports feeling pain mgmt  better with dilaudid/ Tolerating po/ Voiding without problems/ No n/v/ Bleeding is light/ Pain controlled withprescription NSAID's including motrin and narcotic analgesics including hydromorphone (Dilaudid)    O:  A & O x 3 / VS: Blood pressure 106/69, pulse 82, temperature 98 F (36.7 C), temperature source Oral, resp. rate 18, weight 78.3 kg, last menstrual period 01/06/2021, SpO2 98 %, unknown if currently breastfeeding.  LABS: No results found for this or any previous visit (from the past 24 hour(s)).  I&O: I/O last 3 completed shifts: In: -  Out: 1800 [Urine:1800]   No intake/output data recorded.  Lungs: chest clear, no wheezing, rales, normal symmetric air entry  Heart: regular rate and rhythm, S1, S2 normal, no murmur, click, rub or gallop  Abdomen: soft uterus firm @ umb primary dressing d/c/i  Perineum: not inspected  Lochia: light  Extremities:no redness or tenderness in the calves or thighs, edema tr+    A/P: POD # 2/PPD # 2/ [id number]  Doing well  Continue routine post partum orders  D/c in am if remains stable

## 2021-10-09 NOTE — Progress Notes (Signed)
Per chart review, a service is scheduled for today at 2pm. CSW followed up with MOB's RN. RN reported that MOB has been tearful today and is currently accompanied by her husband and parents.   CSW met with parents at bedside and introduced self. CSW asked if parents needed anything, FOB reported no needs. CSW agreed to check in with parents later this week or next week, parents agreeable.    Abundio Miu, [place] Worker [facility] Cell#: [phone]

## 2021-10-10 ENCOUNTER — Ambulatory Visit: Payer: BC Managed Care – PPO

## 2021-10-10 LAB — SURGICAL PATHOLOGY

## 2021-10-10 MED ORDER — IBUPROFEN 600 MG PO TABS: 600.0000 mg | ORAL_TABLET | Freq: Four times a day (QID) | ORAL | 11 refills | Status: AC | PRN

## 2021-10-10 MED ORDER — HYDROMORPHONE HCL 4 MG PO TABS: 4.0000 mg | ORAL_TABLET | Freq: Four times a day (QID) | ORAL | 0 refills | Status: AC | PRN

## 2021-10-10 NOTE — Plan of Care (Signed)
Patient to be discharged home with printed instructions. Zale Marcotte L Marnesha Gagen, RN  

## 2021-10-10 NOTE — Discharge Instructions (Signed)
Call if temperature greater than equal to 100.4, nothing per vagina for 4-6 weeks or severe nausea vomiting, increased incisional pain , drainage or redness in the incision site, no straining with bowel movements, showers no bath °

## 2021-10-10 NOTE — Discharge Summary (Signed)
Postpartum Discharge Summary  Date of Service updated     Patient Name: Felicia Baxter DOB: 1981/03/06 MRN: [id number]  Date of admission: 10/04/2021 Delivery date:   Torri, Michalski [[id number]]  10/07/2021    Kevin, Mario [[id number]]  10/07/2021  Delivering provider:    Kiernan, Farkas [[id number]]  Paola Aleshire, Loveda, Colaizzi [[id number]]  Raivyn Kabler  Date of discharge: 10/10/2021  Admitting diagnosis: PPROM, [place], Previous C/S, Twin gestation, IUP @ 101 2/7 wk, Polyhydramnios twin A Intrauterine pregnancy: [redacted]w[redacted]d    Secondary diagnosis:  Anencephalic twin A, ventriculomegaly Twin B Additional problems: IVF pregnancy, AMA    Discharge diagnosis:  Preterm labor delivered, previous C/S, mild preeclampsia, AMA, polyhydramnios ( twin A). Fetal demise twin A, hydrocephaly twin B, IUP @ 3542/7 wk, IVF pregnancy                                               Post partum procedures:blood transfusion Augmentation: N/A Complications: None  Hospital course: Onset of Labor With Unplanned C/S   41y.o. yo [id number] [facility]  [facility].  In [facility], she was given Nifedipine x 3  due to persistent regular contractions. Pt was seen at a hospital near where she was staying  for c/o leaking fluid and premature contraction as well as vaginal bleeding.  Per pt she was told she was nitrazine and fern negative. She [facility]  showed blood mucus = and amnisure was done. Pt was also has a dx with placenta previa.  Amnisure was positive. Therefore the patient was placed on antepartum where she was started on magnesium sulfate to buy time for steroids. MFM consult was also obtained. Please see their kind note. She underwent sonogram  which revealed resolution of the previa as well as polyhydramnios on twin A contributing to the premature  contractions. She underwent therapeutic amniocentesis with removal of 1300 ml of fluid. Pt was also started on procardia for ctx. IV fluid boluses were given as well. NICU consult done. Both babies were placed on continuous monitoring. Due to original dx of PPROM, pt was placed on  PPROM antibiotics.  Ucx was negative. The antibiotics were discontinued when it was deemed that the amnisure was false positive in discussion with MFM ( Dr ShDonalee Citrinas well as resolution of the placenta previa. Patient had a labor course significant for increased painful contractions that prompted  internal exam . Pt was 5 cm dilated and was therefore transferred to OR for repeat C/S. See operative report The patient went for cesarean section due to  unstoppable preterm labor and previous C/S. Postoperatively she was started on magnesium sulfate for mild preeclampsia, the remaining postoperative was done with pain mgmt and support due to her loss . Delivery details as follows: Membrane Rupture Time/Date:    GeDell Ponto0[[id number]]1:08 AM    ChMoshe Ciprohanell [[id number]] ,   GeDell Ponto0[[id number]]10/07/2021    ChVenetia Constable0[[id number]]   Delivery Method:   GeDell Ponto0[[id number]]C-Section, Low Transverse    ChAnnabeth, [place]ora0[[id number]]C-Section, Low Transverse  Details of operation can be found  in separate operative note. Patient had an uncomplicated postpartum course.  She is ambulating,tolerating a regular diet, passing flatus, and urinating well.  Patient is discharged home in stable condition 10/15/21.  Newborn Data: Birth date:   Dell Ponto [[id number]]  10/07/2021    Kaneesha, Constantino [[id number]]  10/07/2021  Birth time:   Dell Ponto [[id number]]  1:08 AM    Moshe Cipro [place] [[id number]]  1:09 AM  Gender:   Dell Ponto [[id number]]  Female    Tammye, K[place] [[id number]]  Female  Living  status:   Dell Ponto [[id number]]  Living    Taia, Bramlett [place] [[id number]]  Living  Apgars:   Dell Ponto [[id number]]  1    Laconya, Clere [place] [[id number]]  3 ,   Dell Ponto [[id number]]  1    Shantell, Belongia [place] [[id number]]  8  Weight:   Dell Ponto [[id number]]  1.18 kg    Ranyah, Groeneveld [[id number]]  1.94 kg   Magnesium Sulfate received: Yes: for [facility] pending completion of steroids and post delivery for seizure prophylaxis due to mild preeclampsia BMZ received: Yes Rhophylac:No MMR:No T-DaP:Given prenatally Flu: No Transfusion:No  Physical exam  Vitals:   10/09/21 2325 10/10/21 0503 10/10/21 0846 10/10/21 1231  BP: 103/60 108/79 124/77 120/72  Pulse: 97 77 (!) 104 83  Resp: _0 Temp: 98.1 F (36.7 C) 98.1 F (36.7 C) 98.7 F (37.1 C) 98.3 F (36.8 C)  TempSrc: Oral Oral Oral Oral  SpO2: 98% 99%    Weight:       General: alert, cooperative, and no distress Lochia: appropriate Uterine Fundus: firm Incision: N/A DVT Evaluation: No evidence of DVT seen on physical exam. Calf/Ankle edema is present Labs: Lab Results  Component Value Date   WBC 15.1 (H) 10/07/2021   HGB 9.1 (L) 10/07/2021   HCT 27.6 (L) 10/07/2021   MCV 84.4 10/07/2021   PLT 148 (L) 10/07/2021   CMP Latest Ref Rng & Units 10/06/2021  Glucose 70 - 99 mg/dL 104(H)  BUN 6 - 20 mg/dL 8  Creatinine 0.44 - 1.00 mg/dL 1.02(H)  Sodium 135 - 145 mmol/L 130(L)  Potassium 3.5 - 5.1 mmol/L 4.0  Chloride 98 - 111 mmol/L 99  CO2 22 - 32 mmol/L 19(L)  Calcium 8.9 - 10.3 mg/dL 8.4(L)  Total Protein 6.5 - 8.1 g/dL 5.4(L)  Total Bilirubin 0.3 - 1.2 mg/dL 0.6  Alkaline Phos 38 - 126 U/L 176(H)  AST 15 - 41 U/L 23  ALT 0 - 44 U/L 10   Edinburgh Score: Edinburgh Postnatal Depression Scale Screening Tool 10/08/2021  I have been able to laugh and see the funny side of things. (No Data)      After visit meds:  Allergies as of  10/10/2021       Reactions   Percocet [oxycodone-acetaminophen] Nausea And Vomiting   Amitiza [lubiprostone]         Medication List     TAKE these medications    doxylamine (Sleep) 25 MG tablet Commonly known as: UNISOM Take 25 mg by mouth at bedtime as needed.   FOLIC ACID PO Take by mouth.   HYDROmorphone 4 MG tablet Commonly known as: DILAUDID Take 1 tablet (4 mg total) by mouth every 6 (six) hours as needed for up to 7 days for severe pain or moderate pain.   ibuprofen 600 MG tablet Commonly known as: ADVIL Take 1 tablet (600 mg total) by  mouth every 6 (six) hours as needed.   pantoprazole 40 MG tablet Commonly known as: PROTONIX Take 40 mg by mouth daily.   prenatal multivitamin Tabs tablet Take 1 tablet by mouth daily at 12 noon.               Durable Medical Equipment  (From admission, onward)           Start     Ordered   10/07/21 1457  For home use only DME double electric breast pump  Once       Comments: Z39.1   10/07/21 1456             Discharge home in stable condition Infant Feeding:  pumping Infant Disposition:NICU( twin B). Twin A( fetal demise) Discharge instruction: per After Visit Summary and Postpartum booklet. Activity: Advance as tolerated. Pelvic rest for 6 weeks.  Diet: iron rich diet Anticipated Birth Control: Unsure Postpartum Appointment:6 weeks Additional Postpartum F/U:  n/a Future Appointments:No future appointments. Follow up Visit:  Follow-up Information     Servando Salina, MD Follow up in 6 week(s).   Specialty: Obstetrics and Gynecology Contact information: [address] [place] [address] [phone]                     10/10/2021 Marvene Staff, MD

## 2021-10-10 NOTE — Progress Notes (Signed)
Follow up visit with Terrianna at her bedside. Chaplain asked open ended questions to facilitate emotional expression and story telling. Ailie shared about her experience of the funeral yesterday. She found it painful, but helpful. She is grateful to have some time without so many visitors present and thinks she'll be going home today. She continues to balance her own physical recovery with care and concern for [facility] and grief for [facility]. Chaplain normalized the need to triage so many things that would individually overwhelm someone let alone manage them all at once. We discussed ways to keep Shyra's memory alive while still living in the present. Chaplain provided reflective listening, grief support, and education.   Continued spiritual care support will be available after discharge.  Please page as further needs arise.  Maryanna Shape. Carley Hammed, M.Div. [facility] Chaplain Pager [phone] Office [phone]

## 2021-10-10 NOTE — Progress Notes (Signed)
SVD: repeat  S:  Pt reports feeling better with new pain med/ Tolerating po/ Voiding without problems/ No n/v/ Bleeding is light/ Pain controlled withacetaminophen and narcotic analgesics including dilaudid . Baby #2 remains in NICU doing well per pt    O:  A & O x 3 / VS: Blood pressure 120/72, pulse 83, temperature 98.3 F (36.8 C), temperature source Oral, resp. rate 18, weight 78.3 kg, last menstrual period 01/06/2021, SpO2 99 %, unknown if currently breastfeeding.  LABS: No results found for this or any previous visit (from the past 24 hour(s)).  I&O: I/O last 3 completed shifts: In: -  Out: 1300 [Urine:1300]   No intake/output data recorded.  Lungs: chest clear, no wheezing, rales, normal symmetric air entry  Heart: regular rate and rhythm, S1, S2 normal, no murmur, click, rub or gallop  Abdomen: soft uterus firm at umb  Perineum: not inspected  Lochia: light  Extremities:no redness or tenderness in the calves or thighs, edema tr+    A/P: POD # 3/PPD # 3 [id number]  Doing well  Continue routine post partum orders  D/c home  D/C instructions reviewed F/u 6 wk

## 2021-10-10 NOTE — Lactation Note (Signed)
This note was copied from a baby's chart. Lactation Consultation Note  Patient Name: Felicia Baxter ZCHYI'F Date: 10/10/2021 Reason for consult: Follow-up assessment;NICU baby;Multiple gestation;Preterm <34wks;Breast augmentation Age:42 days  Lactation followed up with Felicia Baxter. She was alone in her room and stated that she needed to pump. Her last session was around 0700. She endorses breast changes in the past 24 hours indicating onset of lactogenesis II. I did not note engorgement at this time, but Felicia Baxter states that she is experiencing aches and pains in the breasts.  I assisted her with pumping and stayed a full pumping session. She collected about 3-5 mls, in total. I encouraged her to take her Symphony pump kit to the NICU today; she is being discharged. She has her Medela "stork" pump at home.  I provided ice and a belly band. I recommended that she pump q 3 hours minimally, and more, as needed, to prevent engorgement.  Maternal Data Does the patient have breastfeeding experience prior to this delivery?: No  Feeding Mother's Current Feeding Choice: Breast Milk and Donor Milk  Lactation Tools Discussed/Used Tools: Pump Flange Size: 24 Breast pump type: Double-Electric Breast Pump Pump Education: Setup, frequency, and cleaning;Milk Storage Reason for Pumping: NICU Pumping frequency: rec q3 hours; more frequent as needed to prevent engorgement Pumped volume: 4 mL (4-5 mls)  Interventions Interventions: DEBP;Breast feeding basics reviewed;Education;Ice  Discharge Discharge Education: Engorgement and breast care Pump: DEBP;Stork Pump (Has Stork Pump at Home)  Consult Status Consult Status: Follow-up Date: 10/10/21 Follow-up type: In-patient    Lenore Manner 10/10/2021, 11:30 AM

## 2021-10-11 ENCOUNTER — Ambulatory Visit: Payer: Self-pay

## 2021-10-11 NOTE — Lactation Note (Signed)
This note was copied from a baby's chart. Lactation Consultation Note  Patient Name: Felicia Baxter IWOEH'O Date: 10/11/2021 Reason for consult: Follow-up assessment;NICU baby;Mother's request;Preterm <34wks;Multiple gestation;Breast augmentation;Other (Comment);Infant < 6lbs (AMA) Age:42 days  Visited with mom of 55 24/43 days old (adjusted) NICU twin female, she requested LC assistance today because her breast were getting sore and painful. LC noticed some swelling, breast are filling in and mom had some "knots" on both breast but particularly on the R one. Assisted mom with ice packs, set up the DEBP in baby's room and brought bottles in the room.  Mom is still using the initiation setting on her pump. Assisted with pumping session and showed her how to use the expression setting instead, she got 50 ml of breastmilk while on [facility] consultation, mom still pumping when exiting the room. Reviewed engorgement prevention/treatment, pumping schedule, supply/demand, lactogenesis II and benefits of premature milk.   Maternal Data  Mom's supply is coming in and is WNL  Feeding Mother's Current Feeding Choice: Breast Milk and Donor Milk  Lactation Tools Discussed/Used Tools: Pump;Flanges Flange Size: 24 Breast pump type: Double-Electric Breast Pump Pump Education: Setup, frequency, and cleaning;Milk Storage Reason for Pumping: pre-term in NICU Pumping frequency: 8 times/24 hours Pumped volume: 50 mL (during LC consultation, she was pumping 10 ml before that)  Interventions Interventions: Breast feeding basics reviewed;Breast massage;Hand express;DEBP;Ice;Education  Plan of care Encouraged mom to continue pumping consistently every 3 hours, at least 8 pumping sessions/24 hours She'll continue icing prior pumping and massaging the breast during pumping, will use expression setting on her pump  No other support person at this time. All questions and concerns answered, mom to call NICU LC  PRN.  Discharge Discharge Education: Engorgement and breast care Pump: DEBP;Stork Pump  Consult Status Consult Status: Follow-up Date: 10/11/21 Follow-up type: In-patient   Felicia Baxter 10/11/2021, 4:23 PM

## 2021-10-11 NOTE — Lactation Note (Addendum)
This note was copied from a baby's chart. Lactation Consultation Note  Patient Name: Felicia Baxter IOEVO'J Date: 10/11/2021   Age:42 days  TELEPHONE CALL  NICU RN Onalee Hua asked NICU [facility] to check on mom; she had requested lactation when he called to give her an update regarding baby. This LC called mom and she voiced that her supply has decreased in comparison to what she was getting at the hospital.  Mom will be coming to see baby this afternoon and will ask NICU RN to page lactation for an in-person follow up visit.   [name] [name] 10/11/2021, 11:07 AM

## 2021-10-12 ENCOUNTER — Ambulatory Visit: Payer: Self-pay

## 2021-10-12 NOTE — Lactation Note (Signed)
This note was copied from a baby's chart. Lactation Consultation Note  Patient Name: Felicia Baxter KNLZJ'Q Date: 10/12/2021 Reason for consult: Follow-up assessment;NICU baby;Multiple gestation;Mother's request;Preterm <34wks;Breast augmentation;Other (Comment) (AMA) Age:42 days  Visited with mom of 48 18/65 weeks old (adjusted) NICU twin female, she requested to see lactation because she's struggling with her supply again. Breasts felt soft upon examination, no S/S of engorgement. Mom reports she's not getting the same amount of milk when she pumps at the hospital with our Symphony pump Vs. her pump at home. Mom has a Medela Max flow at home.  This LC assisted mom again with pumping, breast massage, hand expression and washing parts, even though she wasn't getting the same amount she got yesterday when she pumped 50 ml; she voiced that she's still getting more volume here at the hospital Vs. her personal pump. Reviewed some strategies to increase supply.  Maternal Data  Mom's supply has dipped again and is BNL at 5 days post-partum  Feeding Mother's Current Feeding Choice: Breast Milk and Donor Milk  Lactation Tools Discussed/Used Tools: Pump;Flanges Flange Size: 24 Breast pump type: Double-Electric Breast Pump Pump Education: Setup, frequency, and cleaning;Milk Storage Reason for Pumping: pre-term in NICU Pumping frequency: 6-7 times/24 hours Pumped volume:  (drops to 7 ml)  Interventions Interventions: Breast feeding basics reviewed;Breast massage;Hand express;DEBP;Education  Plan of care Encouraged mom to continue pumping consistently every 3 hours, at least 8 pumping sessions/24 hours She'll start power pumping in the AM  Mom wishes to bring her personal pump from home to troubleshoot it   FOB present and supportive. All questions and concerns answered, mom to call NICU LC PRN.  Discharge Pump: DEBP;Stork Pump  Consult Status Consult Status: Follow-up Date:  10/12/21 Follow-up type: In-patient   Felicia Baxter 10/12/2021, 3:40 PM

## 2021-10-13 ENCOUNTER — Ambulatory Visit: Payer: Self-pay

## 2021-10-13 NOTE — Lactation Note (Addendum)
This note was copied from a baby's chart. Lactation Consultation Note  Patient Name: Karinah Stoudmire [id number] Date: 10/13/2021 Reason for consult: Late-preterm 34-36.6wks;Follow-up assessment;NICU baby;Mother's request;Multiple gestation;Infant < 6lbs;Breast augmentation;Other (Comment) (AMA) Age:42 days  Visited with mom of 29 19/42 weeks old (adjusted) NICU female, she brought her Medela Pump and Style Stork pump to the hospital for troubleshooting. LC and mom went through the instructions, and found the correct setting for her, showed her the difference between initiation and expression setting on her personal pump.  Mom doing STS with baby when entered the room, praised her for her efforts. She reports she's slowed down on her pumping frequency, explained to her the importance of consistent pumping to keep up with her supply.   Maternal Data  Mom's supply is BNL.Inquired about her breast augmentation surgery, she voices she was an "A" cup prior that and that she never BF her first child, now 89 y.o. She doesn't remember if her milk came in with her first baby. Will continue monitoring.  Feeding Mother's Current Feeding Choice: Breast Milk and Donor Milk  Lactation Tools Discussed/Used Tools: Pump;Flanges Flange Size: 24 Breast pump type: Double-Electric Breast Pump Pump Education: Setup, frequency, and cleaning;Milk Storage Reason for Pumping: LPI in NICU Pumping frequency: 3 times/24 hours Pumped volume: 20 mL  Interventions Interventions: Breast feeding basics reviewed;DEBP;Education  Plan of care Encouraged mom to continue pumping consistently every 3 hours, at least 8 pumping sessions/24 hours She'll start power pumping in the AM STS care was also encouraged, especially at baby's feeding times   No other support person at this time. All questions and concerns answered, mom to call NICU LC PRN.  Discharge Pump: DEBP;Stork Pump  Consult Status Consult Status:  Follow-up Date: 10/13/21 Follow-up type: In-patient   Sourish Allender Francene Boyers 10/13/2021, 4:14 PM

## 2021-10-14 ENCOUNTER — Ambulatory Visit: Payer: Self-pay

## 2021-10-14 NOTE — Lactation Note (Signed)
This note was copied from a baby's chart. Lactation Consultation Note  Patient Name: Felicia Baxter JEHUD'J Date: 10/14/2021 Reason for consult: Follow-up assessment;Mother's request;NICU baby;Preterm <34wks Age:42 days  I followed up with [name] upon request. She was using the Symphony pump upon entry. I educated on best practices with pumping. She has not been pumping consistently. We discussed the option to pump around baby's feeding schedule. I also suggested she could download a pumping app that will push reminders to her phone.   She states that she has not been feeling well. She describes her symptoms as feeling drugged, and she is light-headed. She states that because her delivery was emergent, she abruptly discontinued taking Cymbalta last week (due to immediate hospital admission), and she wonders if she is having a reaction. I recommended that she contact her OB today, and I observed her put in a message in to Dr. Cherly Hensen via MyChart. I recommended calling the RN at her OB office (Dr. Cherly Hensen) if she does not gain a response soon. Mom states that she would like to wean from Cymbalta completely. I recommended that she discuss that with her provider.  I looked up Cymbalta in [employer], and it is not contraindicated for breast feeding. [name] states that she was taking it every other day, but she could not recall the dose. I asked her to provide that information for our records.  I also made [name] aware of the services at [facility] regarding her symptoms.   Feeding Mother's Current Feeding Choice: Breast Milk and Donor Milk  Lactation Tools Discussed/Used Tools: Pump Breast pump type: Double-Electric Breast Pump Pump Education: Setup, frequency, and cleaning Reason for Pumping: NICU; preterm Pumping frequency: inconsistent; recommended 8 times a day Pumped volume: 35 mL  Interventions Interventions: Breast feeding basics reviewed;DEBP;Education  Discharge Pump:  DEBP;Personal;Stork Pump  Consult Status Consult Status: Follow-up Date: 10/14/21 Follow-up type: In-patient    Walker Shadow 10/14/2021, 12:34 PM

## 2021-10-16 ENCOUNTER — Ambulatory Visit: Payer: BC Managed Care – PPO

## 2021-10-19 ENCOUNTER — Telehealth (HOSPITAL_COMMUNITY): Payer: Self-pay

## 2021-10-19 NOTE — Telephone Encounter (Signed)
"  Doing good. Baby A passed away. I'm doing ok. Baby B is in the NICU and is doing good. My incision is doing good. The honeycomb dressing is off." RN reviewed incision care with patient. RN told patient how sorry we are for her loss of Twin Baby A. RN reviewed bereavement support group, breastfeeding support group, and baby and me support group with patient. Will e-mail information about these resources to patient. Patient has no concerns or questions about her healing.  EPDS score is 4.  Felicia Baxter [facility] 10/19/2021,1409

## 2021-10-23 ENCOUNTER — Ambulatory Visit: Payer: Self-pay

## 2021-10-23 NOTE — Lactation Note (Signed)
This note was copied from a baby's chart. Lactation Consultation Note  Patient Name: Felicia Baxter YFVCB'S Date: 10/23/2021 Reason for consult: Follow-up assessment;Breast augmentation;Late-preterm 34-36.6wks;NICU baby;Infant < 6lbs;Other (Comment);Multiple gestation [facility]) Age:42 wk.o.  Mother seen for weekly follow-up visit. She reports that her milk supply has increased and she was able to fill a whole bottle yesterday. She reports that she power pumped for about 3 days and then stopped. Mother endorsed no nipple or breast pain at this time. Mom is currently pumping and bottle feeding, but plans to take infant to breast when ready.   Encouraged mother to increase frequency of pumping sessions with the inclusion of one power pumping session in the morning. Discussed the inclusion of a power pumping session for at least a week.  Maternal Data  Mother's milk supply has increased, but is still slightly below normal limits.   Feeding Mother's Current Feeding Choice: Breast Milk and Formula   Lactation Tools Discussed/Used Tools: Pump Breast pump type: Double-Electric Breast Pump Pump Education: Setup, frequency, and cleaning Reason for Pumping: LPI in NICU Pumping frequency: every 3-4hr, 6-7x/day Pumped volume: 100 mL  Interventions Interventions: DEBP;Education  Discharge Pump: DEBP;Stork Pump  Care Plan Encourage mother to increase pumping frequency to 8x/24hrs.  Include power pumping session 1x/day.  All questions and concerns addressed. Mother to call NICU LC PRN.   Consult Status Consult Status: Follow-up Date: 10/23/21 Follow-up type: In-patient    [name] 10/23/2021, 3:53 PM

## 2021-10-25 ENCOUNTER — Ambulatory Visit: Payer: Self-pay

## 2021-10-25 NOTE — Lactation Note (Signed)
This note was copied from a baby's chart. Lactation Consultation Note  Patient Name: Felicia Baxter [id number] Date: 10/25/2021 Reason for consult: 1st time breastfeeding;Follow-up assessment;Mother's request;NICU baby;Multiple gestation Age:42 wk.o.  1230 - Lactation followed up with Felicia Baxter. She states that baby has been introduced to a bottle via no-flow nipple. Ms. Felicia Baxter indicates that she would like to breast feed. She has not attempted to latch Felicia Baxter at this stage. I provided education on breast feeding basics, and I set up a follow up for 1500 today with the Felicia Baxter.  1500 - Lactation and Felicia Baxter Felicia Baxter first, Felicia Baxter later) entered to speak with Felicia Baxter about her feeding goals. Felicia Baxter indicated that she wanted to breast feed prior to introduction of a bottle. She is aware of Felicia Baxter services and verbalized understanding regarding regular check-ins by Felicia Baxter. We counseled on how feeding plan could be discussed and adapted, and I encouraged her to ask questions and voice any concerns.  Felicia Baxter was awake at this visit; Felicia Baxter changed a dirty diaper. Baby was alert and slightly fussy with diaper change. I placed her on Felicia Baxter's right breast, and showed Felicia Baxter how to position baby in cross cradle hold. I encouraged her to express her milk to allow baby to lick and learn. Baby immediately latched with rhythmic suckling sequences noted. Baby fed for a minute or two and released the breast. She became sleepy. Shortly thereafter, I placed baby on Felicia Baxter chest.  I encouraged Felicia Baxter to continue to practice lick and learn when visiting. I set up a follow up lactation appointment for 2/18 at 1200  or 1500.  Maternal Data Has patient been taught Hand Expression?: Yes Does the patient have breastfeeding experience prior to this delivery?: No  Feeding Mother's Current Feeding Choice: Breast Milk  LATCH Score Latch: Repeated attempts needed to sustain latch, nipple held in mouth  throughout feeding, stimulation needed to elicit sucking reflex.  Audible Swallowing: A few with stimulation  Type of Nipple: Everted at rest and after stimulation  Comfort (Breast/Nipple): Soft / non-tender  Hold (Positioning): Assistance needed to correctly position infant at breast and maintain latch.  LATCH Score: 7   Lactation Tools Discussed/Used Breast pump type: Double-Electric Breast Pump Pump Education: Setup, frequency, and cleaning Reason for Pumping: NICU Pumping frequency: rec q3 hours Pumped volume: 60 mL  Interventions Interventions: Breast feeding basics reviewed;Assisted with latch;Hand express;Support pillows;Breast compression;Education  Discharge Pump: DEBP;Stork Pump  Consult Status Consult Status: Follow-up Date: 10/25/21 Follow-up type: In-patient    Lenore Manner 10/25/2021, 3:29 PM

## 2021-10-26 ENCOUNTER — Ambulatory Visit: Payer: Self-pay

## 2021-10-26 NOTE — Lactation Note (Signed)
This note was copied from a baby's chart. Lactation Consultation Note  Patient Name: Felicia Baxter [id number] Date: 10/26/2021 Reason for consult: 1st time breastfeeding;NICU baby;Mother's request;Breastfeeding assistance;Multiple gestation;Infant < 6lbs;Late-preterm 34-36.6wks Age:42 wk.o.  Visited with mom of 73 81/57 weeks old (adjusted) NICU female, she reports pumping is going well even though she's only doing it every 4 hours. Mom requested a feeding assist with baby "[name]", took baby to mom's right breast in cross cradle position but baby was very sleepy (see LATCH score).  Reviewed feeding cues and readiness, mom voiced that she'd like to take baby to breast whenever she's ready and not just around her feeding times. Spoke to NICU RN Terri and she reported to [facility] that baby is still on scheduled feedings and can only feed/have attempts at the breast within 30 minutes of those feedings, but she's OK with baby doing STS and nuzzling at the breast outside of those times.  Maternal Data  Mom's supply is WNL  Feeding Mother's Current Feeding Choice: Breast Milk  LATCH Score Latch: Too sleepy or reluctant, no latch achieved, no sucking elicited. (baby did not wake up for this feeding, when doing suck training she tends to "bite" instead of sucking)  Audible Swallowing: None  Type of Nipple: Everted at rest and after stimulation  Comfort (Breast/Nipple): Soft / non-tender  Hold (Positioning): Assistance needed to correctly position infant at breast and maintain latch.  LATCH Score: 5  Lactation Tools Discussed/Used Tools: Pump;Flanges Flange Size: 24 Breast pump type: Double-Electric Breast Pump Pump Education: Setup, frequency, and cleaning;Milk Storage Reason for Pumping: LPI in NICU Pumping frequency: 6 times/24 hours Pumped volume: 120 mL (120-150 ml per mom)  Interventions Interventions: Breast feeding basics reviewed;Assisted with latch;Breast massage;Hand  express;Support pillows;Adjust position;DEBP;Education  Plan of care Encouraged mom to continue pumping consistently every 3 hours, at least 8 pumping sessions/24 hours She'll continue power pumping in the AM She'll continue taking baby to breast on feeding cues, just nuzzling if it's outside her feedings times   FOB present and supportive. All questions and concerns answered, parents to call NICU LC PRN.  Discharge Pump: DEBP;Stork Pump  Consult Status Consult Status: Follow-up Date: 10/26/21 Follow-up type: In-patient   [name] Francene Boyers 10/26/2021, 3:46 PM

## 2021-10-28 ENCOUNTER — Ambulatory Visit: Payer: Self-pay

## 2021-10-28 NOTE — Lactation Note (Signed)
This note was copied from a baby's chart. Lactation Consultation Note  Patient Name: Emersyn Frias [id number] Date: 10/28/2021 Reason for consult: Follow-up assessment;NICU baby Age:42 wk.o.  Mom concerned about engorged breast. Advised her to apply cold compress and take ibuprofen before pumping, and apply warm compress while pumping if engorgement occurs again.  Printed mom more labels for bottles and gave mom heating packs.   Mom to continue pumping q3-4.    Feeding Mother's Current Feeding Choice: Breast Milk   Lactation Tools Discussed/Used Breast pump type: Double-Electric Breast Pump Reason for Pumping: NICU Pumping frequency: 90-165ml q3-4  Interventions Interventions: Education   Consult Status Consult Status: Follow-up Date: 10/28/21 Follow-up type: In-patient    Joeseph Amor Robinson-Sullivan 10/28/2021, 1:57 PM

## 2021-10-31 ENCOUNTER — Ambulatory Visit: Payer: Self-pay

## 2021-10-31 NOTE — Lactation Note (Signed)
This note was copied from a baby's chart.  NICU Lactation Consultation Note  Patient Name: Felicia Baxter [id number] Date: 10/31/2021 Age:42 wk.o.   Subjective Reason for consult: Follow-up assessment; NICU baby  Lactation followed up with Felicia Baxter. She was breastfeeding baby Felicia Baxter upon entry, and she states this is the best baby has done at the breast this week. Baby latched on her own. Lactation noted rhythmic suckling sequences and good flanging, but latch appeared to be slightly shallow. Felicia Baxter denied pain.  Lactation and SLP conferred with Felicia Baxter. SLP recommended delaying bottle introduction after this morning's feeding assessment. We encouraged pre-feeding activities such as breast feeding, putting baby near the breast at feeding times, and paci dips. I encouraged Felicia Baxter to continue with consistent pumping and to try to pump q3 hours (vs. Some four hour gaps) to prevent evening time engorgement episodes. I also recommended using ice during these instances in addition to heat.  Objective Infant data:  Infant feeding assessment Scale for Readiness: 2 Scale for Quality: 4   Maternal data: [id number]  C-Section, Low Transverse  Current breast feeding challenges:: NICU; LIP LPI (not LIP) Pumping frequency: Q3-4 hours; recommended q3 hours to avoid evening-time engorgement Pumped volume: 45 mL (80-90 on average; some evening pumping sessions were 6-8 oz)   WIC Program: No Pump: Personal  Assessment Infant: LATCH Score: 9 Note: Brief latch - 6 minutes  Maternal: Milk volume: Normal  Intervention/Plan Interventions: Breast feeding basics reviewed; Education  Pump Education: Setup, frequency, and cleaning  Plan: Consult Status: Follow-up Offer breast at scheduled feeding times. Pump q3 hours.  NICU Follow-up type: Assist with IDF-1 (Mother to pre-pump before breastfeeding); Assist with IDF-2 (Mother does not need to pre-pump before  breastfeeding)    Felicia Baxter 10/31/2021, 3:12 PM

## 2021-11-01 ENCOUNTER — Ambulatory Visit: Payer: Self-pay

## 2021-11-01 NOTE — Lactation Note (Signed)
This note was copied from a baby's chart.  NICU Lactation Consultation Note  Patient Name: Felicia Baxter [id number] Date: 11/01/2021 Age:41 y.o.   Subjective Reason for consult: Follow-up assessment; Mother's request; NICU baby; Engorgement  Ms. Mineer paged for lactation due to repeat engorgement occurring in the evening. She states that around 1900 last night, the breasts were sore, full and painful. She states that pumping did not relieve the pressure. I assisted her with pumping, and I noted that she was able to pump a higher amount (2x more) on the right side than the left. The left side has been more sore. I provided ice to the affected breast and encouraged frequent cold therapy. I also recommended that she not over-massage the breast and and to allow it time to rest and heal between pumping sessions. I did not palpate any knots, though her breasts were more full on the outer quadrant.  I encouraged rest, frequent pumping (increasing from every 4 hours in the after noon and evening to every 2-3 hours in the afternoon and evening), and ice. Lactation to follow up this weekend to see if the issue has resolved.  Objective Infant data: Mother's Current Feeding Choice: Breast Milk  Infant feeding assessment Scale for Readiness: 2 Scale for Quality: 4   Maternal data: [id number]  C-Section, Low Transverse  Current breast feeding challenges:: Engorgement   Pumping frequency: q6 (every three to four hours) Pumped volume: 130 mL   WIC Program: No Pump: DEBP, Personal  Assessment Infant: LATCH Score: 9   Maternal: Milk volume: Normal   Intervention/Plan Interventions: Breast massage; DEBP; Education; Ice  Tools: Pump; Hands-free pumping top Pump Education: Setup, frequency, and cleaning  Plan: Consult Status: Follow-up  NICU Follow-up type: Weekly NICU follow up; Verify absence of engorgement    Walker Shadow 11/01/2021, 2:42 PM

## 2021-11-02 ENCOUNTER — Ambulatory Visit: Payer: Self-pay

## 2021-11-02 NOTE — Lactation Note (Signed)
This note was copied from a baby's chart.  NICU Lactation Consultation Note  Patient Name: Felicia Baxter [id number] Date: 11/02/2021 Age:42 wk.o.   Subjective Reason for consult: Follow-up assessment; Engorgement Mother has experienced breast pain over the past few days. She is relieved that her symptoms began to improve last night. She continues to pump q3. We reviewed s/s of plugged ducts and mastitis, and strategies to treat.   Objective Infant data: Mother's Current Feeding Choice: Breast Milk  Infant feeding assessment Scale for Readiness: 3 Scale for Quality: 4    Maternal data: [id number]  C-Section, Low Transverse Current breast feeding challenges:: Engorgement  Pumping frequency: q6 (every three to four hours) Pumped volume: 130 mL   WIC Program: No Pump: DEBP, Personal  Assessment Maternal: Milk volume: Normal   Intervention/Plan Interventions: Education  Tools: Pump; Hands-free pumping top Pump Education: Setup, frequency, and cleaning  Plan: Consult Status: Follow-up  NICU Follow-up type: Weekly NICU follow up  Mother will continue current poc. She will consider lecithin if s/s of plugged ducts continue.  Gwynne Edinger 11/02/2021, 2:06 PM

## 2021-11-04 ENCOUNTER — Ambulatory Visit: Payer: Self-pay

## 2021-11-04 NOTE — Lactation Note (Signed)
This note was copied from a baby's chart. Lactation Consultation Note Returned after discussion with SLP Byrd Hesselbach) regarding advancing breast and bottle. Mother would like to re-challenge at breast tomorrow. LC will plan f/u to assist prn.  Patient Name: Felicia Baxter [id number] Date: 11/04/2021 Reason for consult: Mother's request Age:42 wk.o.  Feeding Mother's Current Feeding Choice: Breast Milk   Lactation Tools Discussed/Used Pumping frequency: Every 3 hours  Interventions Interventions: Education  Consult Status Consult Status: Follow-up Date: 11/04/21 Follow-up type: In-patient   Elder Negus 11/04/2021, 5:14 PM

## 2021-11-04 NOTE — Lactation Note (Addendum)
This note was copied from a baby's chart.  NICU Lactation Consultation Note  Patient Name: Felicia Baxter [id number] Date: 11/04/2021 Age:42 wk.o.   Subjective Reason for consult: Mother's request  Per Mother's request visited to discuss treatment for recurring engorgement. Mom states that every 2 days breasts become engorged even on a consistent pumping schedule. Educated Mom on the use of lecithin supplement to assist with recurring engorgement. Mom also asked if she can continue her attempts to put baby to breast once discharged. LC informed Mom that she can continue efforts to breastfeed when baby shows feeding cues and to notice when baby is becoming tired at breast which indicates the end of a feeding session. LC noticed that Mom was wiping milk away from baby's mouth during bottle feeding. Mom confirmed that milk regularly leaks from baby's mouth during feedings. LC stated that milk leakage could be an indication that baby's latch lacks a seal and LC would contact SLP to follow up.  Objective Infant data: Mother's Current Feeding Choice: Breast Milk  Infant feeding assessment Scale for Readiness: 2 Scale for Quality: 3    Maternal data: [id number]  C-Section, Low Transverse Pumping frequency: Every 3 hours   WIC Program: No Pump: DEBP, Personal  Assessment Infant:  Milk leaking from baby's mouth likely indicates that baby has immature feeding skills.   Maternal:  Recurrent breast engorgement is likely s/s of plugged duct and may benefit from using lecithin.   Intervention Interventions: Education  Plan:  1) Consider using lecithin supplement 3 times a day for engorgement.  2) Continue pumping Q3-4. 3) LC will follow up with Mom weekly.  4) Continue attempts to put baby at breast when baby shows feeding cues.  5) Mom is aware of lactation services and will contact as needed.  6) Will seek the advice and counsel of her doctor.   Consult Status:  Follow-up  NICU Follow-up type: Weekly NICU follow up; Verify absence of engorgement    Felicia Baxter 11/04/2021, 2:31 PM

## 2021-11-05 ENCOUNTER — Ambulatory Visit: Payer: Self-pay

## 2021-11-05 NOTE — Lactation Note (Signed)
This note was copied from a baby's chart. Lactation Consultation Note  Patient Name: Felicia Baxter [id number] Date: 11/05/2021 Reason for consult: NICU baby;Early term 37-38.6wks;Mother's request;Breastfeeding assistance;Breast augmentation;Other (Comment) (AMA) Age:42 wk.o.  Visited with mom of 66 44/3 weeks old (adjusted) NICU female, she's still experiencing engorgement on her right breast (upper and lower outer quadrants) LC assisted with hand expression and breast massage and provided ice packs.   Mom wanted to put baby at the breast, her affected breast was already leaking, LC took baby to mother's right breast in football position and she latched after a couple of attempts, but it was challenging to keep her sucking, she took several breaks and will only suck with stimulation (see LATCH score).  Reported to NICU RN Felicia Baxter that baby fed for 5 minutes (small pool of colostrum on NS # 16) and she gavaged the remaining of the feeding. Reviewed strategies to treat engorgement and plugged ducts, mom voiced she started taking lecithin yesterday.  Maternal Data  Mom's supply has decreased and is BNL now due to engorgement episode  Feeding Mother's Current Feeding Choice: Breast Milk  LATCH Score Latch: Repeated attempts needed to sustain latch, nipple held in mouth throughout feeding, stimulation needed to elicit sucking reflex. (with NS # 16, sleepy baby and required constant stimulation to continue sucking)  Audible Swallowing: A few with stimulation  Type of Nipple: Everted at rest and after stimulation  Comfort (Breast/Nipple): Soft / non-tender  Hold (Positioning): No assistance needed to correctly position infant at breast.  LATCH Score: 8  Lactation Tools Discussed/Used Tools: Pump;Flanges Flange Size: 24 Breast pump type: Double-Electric Breast Pump Pump Education: Setup, frequency, and cleaning;Milk Storage Reason for Pumping: ETI in NICU Pumping frequency: 8 times/24  hours Pumped volume: 30 mL (30-40 ml)  Interventions Interventions: Assisted with latch;Breast massage;Hand express;Breast compression;Adjust position;Support pillows;DEBP;Ice;Education  Plan of care Encouraged mom to continue pumping consistently every 3 hours, at least 8 pumping sessions/24 hours. She'll pump before the 3 hours mark if her breast are getting uncomfortable She'll continue applying ice packs for 20 minutes prior pumping to treat swelling/engorgement She'll continue taking baby to breast on feeding cues for IDF 2   No other support person at this time. All questions and concerns answered, parents to call NICU LC PRN.  Discharge Discharge Education: Engorgement and breast care Pump: DEBP;Personal  Consult Status Consult Status: Follow-up Date: 11/05/21 Follow-up type: In-patient   Rennie Rouch Venetia Constable 11/05/2021, 3:16 PM

## 2021-11-11 ENCOUNTER — Ambulatory Visit: Payer: Self-pay

## 2021-11-11 NOTE — Lactation Note (Addendum)
This note was copied from a baby's chart. ?Lactation Consultation Note ? ?Patient Name: Felicia Baxter ?Today's Date: 11/11/2021 ?Reason for consult: Follow-up assessment;Preterm <34wks;NICU baby ?Age:42 wk.o. ?.  ?Lactation Games developer met with Mom at infant bedside. Mom had just started pumping when [facility] and student entered room. Mom reports that pumping is going ok overall, but it takes a while to get the milk flowing. Encouraged gentle massage and warm compress only while pumping to get milk flowing. Warm compress provided. Ice/cold therapy to be used after/in between pump sessions to help relieve inflammation and swelling. Mom reports engorgement has subsided somewhat with lecithin daily. Mom continues to pump q3-4 hours "I try not to go more than 4 hours". LC encouraged Mom to aim for q3 hours to minimize risk of engorgement and sustain milk supply. LC provided additional pump education on expression vs let-down mode of pump.  ? ?Mom says she uses a Medela pump at home but output is less than with use of Symphony. Mom says she does not feel as empty with use of that pump, but tries not to go over 30 mins. Will implement warm compress with pumping to assist milk flow.  ? ?Feeding ?Mother's Current Feeding Choice: Breast Milk ?Nipple Type: Nfant Extra Slow Flow (gold) ? ?Lactation Tools Discussed/Used ?Breast pump type: Double-Electric Breast Pump ?Reason for Pumping: EPI in NICU ?Pumping frequency: q3-4 hours ? ?Discharge ?Pump: DEBP;Personal ? ?Consult Status ?Consult Status: Follow-up ?Date: 11/11/21 ?Follow-up type: In-patient ? ?Plan ?Mom to continue pumping q3 hours with goal of 8x within 24 hours for 30 mins ?Mom to use warm compress while pumping to maximize milk flow during pumping sessions.  ?Mom to contact [facility] for assistance as needed.  ? ?Elisabeth Pigeon ?11/11/2021, 3:35 PM ? ? ? ?

## 2021-11-12 ENCOUNTER — Ambulatory Visit: Payer: Self-pay

## 2021-11-12 NOTE — Lactation Note (Signed)
This note was copied from a baby's chart. ? ?NICU Lactation Consultation Note ? ?Patient Name: Felicia Baxter ?Today's Date: 11/12/2021 ?Age:42 wk.o. ? ? ?Subjective ?Reason for consult: Follow-up assessment; Mother's request; NICU baby; Preterm <34wks ? ?Ms. Cadenhead "called" lactation in from the hallway in passing. She states that the [facility] who helped her yesterday provided some useful advice to help increase her milk volume. The LC recommended that she increase the suction on her Symphony pump and then toggle between the initiation setting and the maintain setting. Ms. Gruber states that this has helped her have more milk volume. ? ?Ms. Winrow states that her personal Medela pump is less effective than the Symphony pump. She pumps quite a bit less. I offered to check and see if there was another home pump option that might be more effective. ? ?SLP, Byrd Hesselbach, was in the room to assist with bottle feeding. We discussed Ms. Totman' feeding plan. She states that she had discontinued breast feeding because she was discouraged about her supply. However, she feels better about her milk volume now and is still interested in working on latching baby. We discussed that baby is still learning how to breast/bottle feed, and she could practice at either and still receive remaining volume via NG tube.  ? ?Ms. Henton would like lactation to follow up to assist with breast feeding. Baby is on the 8,2, 11, 5 schedule. I offered to return tomorrow, or she could call lactation at the 1700 feeding today, if desired. ? ?Objective ?Infant data: ?Mother's Current Feeding Choice: Breast Milk ? ?Infant feeding assessment ?Scale for Readiness: 2 ?Scale for Quality: 3 ? ?Maternal data: ?[id number]  ?C-Section, Low Transverse ?Pumping frequency: q3-4 hours ?Pumped volume: 60 mL ? ? ? ?WIC Program: No ?Pump: Personal (interested in a Symphony pump) ? ?Assessment ? ?Maternal: ?Milk volume: Normal ? ? ?Intervention/Plan ?Interventions:  Education ? ?Tools: Pump ?Pump Education: Setup, frequency, and cleaning ? ?Plan: ?Consult Status: Follow-up ? ?NICU Follow-up type: Weekly NICU follow up; Assist with IDF-1 (Mother to pre-pump before breastfeeding); Assist with IDF-2 (Mother does not need to pre-pump before breastfeeding) ? ? ? ?Walker Shadow ?11/12/2021, 2:05 PM ?

## 2021-11-21 ENCOUNTER — Ambulatory Visit: Payer: Self-pay

## 2021-11-21 NOTE — Lactation Note (Signed)
This note was copied from a baby's chart. ?Lactation Consultation Note ? ?Patient Name: Makaelyn Aponte ?Today's Date: 11/21/2021 ?Reason for consult: Follow-up assessment;NICU baby;Preterm <34wks;Multiple gestation ?Age:42 wk.o. ? ?Lactation followed up with Ms. Mclure. She now has a [employer] representative Pump at home courtesy of [employer]. She states that it is "so different" from her Medela personal pump, and her milk volume has increased since she began to use it at home. She continues to pump consistently and pumps "enough" to feed [name]. ? ?We discussed community support resources, and I encouraged her to attend the breast feeding support group.  ? ?Feeding ?Mother's Current Feeding Choice: Breast Milk ?Nipple Type: Nfant Extra Slow Flow (gold) ? ? ?Lactation Tools Discussed/Used ?Breast pump type: Double-Electric Breast Pump ?Pump Education: Setup, frequency, and cleaning ?Reason for Pumping: NICU ?Pumping frequency: q3 hours ?Pumped volume: 75 mL (75-120) ? ?Interventions ?Interventions: Education ? ?Discharge ?Pump: Personal (Family Connects Symphony Pump and medela personal pump) ? ?Consult Status ?Consult Status: Follow-up ?Date: 11/21/21 ?Follow-up type: In-patient ? ? ? ?Walker Shadow ?11/21/2021, 3:48 PM ? ? ? ?

## 2021-11-28 ENCOUNTER — Ambulatory Visit: Payer: Self-pay

## 2021-11-28 NOTE — Lactation Note (Signed)
This note was copied from a baby's chart. ? ?NICU Lactation Consultation Note ? ?Patient Name: Felicia Baxter ?Today's Date: 11/28/2021 ?Age:42 wk.o. ? ? ?Subjective ?Reason for consult: Follow-up assessment; NICU baby; Preterm <34wks ? ?Lactation followed up with Ms. Santor and baby Felicia Baxter. Baby is going ad lib today. Her last feeding, baby P.O.'d 46 mls by bottle. Her RN discussed ad lib process and recommended feeding baby on demand but at a minimum of every 4 hours. ? ?In anticipation of discharge in the near future, I provided our lactation services handout and reviewed our community breast feeding resources. Ms. Marmo stated today that she would like to breast feed Felicia Baxter. She has been primarily bottle feeding her. I offered to return to help with breast feeding today during her ad lib trial. She verbalized understanding. ? ?Ms. Lorensen is interested in a follow up lactation appointment after discharge. I stated that I would place a referral to [facility]. Ms. Whitehorn plans to use [facility] after discharge. ? ?Objective ?Infant data: ?Mother's Current Feeding Choice: Breast Milk ? ?Infant feeding assessment ?Scale for Readiness: 1 ?Scale for Quality: 3 ? ?Maternal data: ?[id number]  ?C-Section, Low Transverse ? ?Current breast feeding challenges:: NICU ? ? ?WIC Program: No ?Pump: Personal (Family Connects Pump) ? ? ?Intervention/Plan ?Interventions: Education; [employer] brochure ? ? ?Plan: ?Consult Status: Follow-up ? ?NICU Follow-up type: Assist with IDF-2 (Mother does not need to pre-pump before breastfeeding) ? ? ?

## 2021-11-30 ENCOUNTER — Ambulatory Visit: Payer: Self-pay

## 2021-11-30 NOTE — Lactation Note (Addendum)
This note was copied from a baby's chart. ? ?NICU Lactation Consultation Note ? ?Patient Name: Felicia Baxter ?Today's Date: 11/30/2021 ?Age:42 wk.o. ? ? ?Subjective ?Reason for consult: Follow-up assessment; Other (Comment) (discharge planning) ?Mother continues to pump often. She plans to advance bf'ing p d/c and requests a referral to outpatient lactation. Mother states that infant will likely d/c early next week.  ? ?Objective ?Infant data: ?Mother's Current Feeding Choice: Breast Milk ? ?Infant feeding assessment ?Scale for Readiness: 1 ?Scale for Quality: 2 ? ? ?  ?Maternal data: ?[id number]  ?C-Section, Low Transverse ?Pumping frequency: q3 ?Pumped volume: 120 mL ? ? ?WIC Program: No ?Pump: Personal (Family Connects Pump) ? ?Assessment ?Infant: ?No data recorded ?Feeding Status: Ad lib ? ? ?Maternal: ?Milk volume: Normal ? ? ?Intervention/Plan ?Interventions: Education ? ?Plan: ?Consult Status: Follow-up ? ?NICU Follow-up type: Weekly NICU follow up ? ?LC to send referral to [facility], IB[facility]. ? ?Felicia Baxter ?11/30/2021, 12:56 PM ?

## 2021-12-02 ENCOUNTER — Ambulatory Visit: Payer: Self-pay

## 2021-12-02 NOTE — Lactation Note (Signed)
This note was copied from a baby's chart. ?Lactation Consultation Note ? ?Patient Name: Felicia Baxter ?Today's Date: 12/02/2021 ?Reason for consult: Follow-up assessment;Other (Comment);Breast augmentation;NICU baby;Term (AMA) ?Age:42 wk.o. ? ?Visited with mom of 65 76/48 weeks old NICU female, Felicia Baxter reports that pumping is going well and that her volumes remain stable. Baby is going home today, reviewed discharge education, LC OP and community resources. Mom's plan is to pump and bottle feed for now but she eventually wants to take baby to breast, referral to [facility]. was sent. All questions and concerns answered, mom to contact lactation services PRN. ? ?Maternal Data ? Mom's supply is WNL ? ?Feeding ?Mother's Current Feeding Choice: Breast Milk ? ?Lactation Tools Discussed/Used ?Tools: Pump;Flanges ?Flange Size: 24 ?Breast pump type: Double-Electric Breast Pump ?Pump Education: Setup, frequency, and cleaning;Milk Storage ?Reason for Pumping: NICU infant ?Pumping frequency: 8 times/24 hours ?Pumped volume: 120 mL ? ?Interventions ?Interventions: DEBP;Education ? ?Discharge ?Discharge Education: Engorgement and breast care;Outpatient recommendation;Outpatient Epic message sent ?Pump: DEBP;Personal (Family Connects pump) ? ?Consult Status ?Consult Status: Complete ?Date: 12/02/21 ?Follow-up type: Call as needed ? ? ?[place] S Whittney Steenson ?12/02/2021, 10:58 AM ? ? ? ?

## 2021-12-11 ENCOUNTER — Other Ambulatory Visit: Payer: Self-pay | Admitting: Obstetrics and Gynecology

## 2021-12-11 ENCOUNTER — Other Ambulatory Visit (HOSPITAL_COMMUNITY): Payer: Self-pay | Admitting: Obstetrics and Gynecology

## 2021-12-11 ENCOUNTER — Encounter (HOSPITAL_BASED_OUTPATIENT_CLINIC_OR_DEPARTMENT_OTHER): Payer: Self-pay | Admitting: Obstetrics and Gynecology

## 2021-12-11 DIAGNOSIS — R9389 Abnormal findings on diagnostic imaging of other specified body structures: Secondary | ICD-10-CM

## 2021-12-12 ENCOUNTER — Other Ambulatory Visit: Payer: Self-pay

## 2021-12-12 ENCOUNTER — Other Ambulatory Visit: Payer: Self-pay | Admitting: Obstetrics and Gynecology

## 2021-12-12 ENCOUNTER — Encounter (HOSPITAL_BASED_OUTPATIENT_CLINIC_OR_DEPARTMENT_OTHER): Payer: Self-pay | Admitting: Obstetrics and Gynecology

## 2021-12-12 NOTE — Progress Notes (Addendum)
Spoke w/ via phone for pre-op interview--- pt ?Lab needs dos----   cbc           ?Lab results------ no ?COVID test -----patient states asymptomatic no test needed ?Arrive at ------- 0700 on 12-20-2021 ?NPO after MN NO Solid Food.  Clear liquids from MN until--- 0600 ?Med rec completed ?Medications to take morning of surgery ----- none ?Diabetic medication ----- n/a ?Patient instructed no nail polish to be worn day of surgery ?Patient instructed to bring photo id and insurance card day of surgery ?Patient aware to have Driver (ride ) / caregiver  for 24 hours after surgery --fiance, bryan ?Patient Special Instructions ----- n/a ?Pre-Op special Istructions ----- I released ultrasound order today (12-12-2021) ?Patient verbalized understanding of instructions that were given at this phone interview. ?Patient denies shortness of breath, chest pain, fever, cough at this phone interview.  ?

## 2021-12-20 ENCOUNTER — Ambulatory Visit (HOSPITAL_BASED_OUTPATIENT_CLINIC_OR_DEPARTMENT_OTHER): Payer: BC Managed Care – PPO | Admitting: Anesthesiology

## 2021-12-20 ENCOUNTER — Other Ambulatory Visit: Payer: Self-pay

## 2021-12-20 ENCOUNTER — Encounter (HOSPITAL_BASED_OUTPATIENT_CLINIC_OR_DEPARTMENT_OTHER)
Admission: RE | Disposition: A | Discharge status: Home / Self Care | Payer: Self-pay | Source: Home / Self Care | Attending: Obstetrics and Gynecology

## 2021-12-20 ENCOUNTER — Ambulatory Visit (HOSPITAL_BASED_OUTPATIENT_CLINIC_OR_DEPARTMENT_OTHER)
Admission: RE | Admit: 2021-12-20 | Discharge: 2021-12-20 | Disposition: A | Discharge status: Home / Self Care | Payer: BC Managed Care – PPO | Attending: Obstetrics and Gynecology | Admitting: Obstetrics and Gynecology

## 2021-12-20 ENCOUNTER — Ambulatory Visit (HOSPITAL_COMMUNITY)
Admission: RE | Admit: 2021-12-20 | Discharge: 2021-12-20 | Disposition: A | Discharge status: Home / Self Care | Payer: BC Managed Care – PPO | Source: Ambulatory Visit | Attending: Obstetrics and Gynecology | Admitting: Obstetrics and Gynecology

## 2021-12-20 ENCOUNTER — Encounter (HOSPITAL_BASED_OUTPATIENT_CLINIC_OR_DEPARTMENT_OTHER): Payer: Self-pay | Admitting: Obstetrics and Gynecology

## 2021-12-20 DIAGNOSIS — R9389 Abnormal findings on diagnostic imaging of other specified body structures: Secondary | ICD-10-CM

## 2021-12-20 HISTORY — PX: DILATION AND CURETTAGE OF UTERUS: SHX78

## 2021-12-20 HISTORY — DX: Female infertility, unspecified: N97.9

## 2021-12-20 HISTORY — DX: Personal history of other diseases of the female genital tract: Z87.42

## 2021-12-20 HISTORY — PX: OPERATIVE ULTRASOUND: SHX5996

## 2021-12-20 HISTORY — DX: Generalized anxiety disorder: F41.1

## 2021-12-20 HISTORY — PX: HYSTEROSCOPY: SHX211

## 2021-12-20 HISTORY — DX: Irritable bowel syndrome, unspecified: K58.9

## 2021-12-20 HISTORY — DX: Abnormal findings on diagnostic imaging of other specified body structures: R93.89

## 2021-12-20 LAB — CBC
HCT: 39.8 % (ref 36.0–46.0)
Hemoglobin: 12.7 g/dL (ref 12.0–15.0)
MCH: 26.8 pg (ref 26.0–34.0)
MCHC: 31.9 g/dL (ref 30.0–36.0)
MCV: 84.1 fL (ref 80.0–100.0)
Platelets: 271 K/uL (ref 150–400)
RBC: 4.73 MIL/uL (ref 3.87–5.11)
RDW: 14.7 % (ref 11.5–15.5)
WBC: 6.7 K/uL (ref 4.0–10.5)
nRBC: 0 % (ref 0.0–0.2)

## 2021-12-20 LAB — POCT PREGNANCY, URINE: Preg Test, Ur: NEGATIVE

## 2021-12-20 SURGERY — US INTRAOPERATIVE
Anesthesia: General

## 2021-12-20 MED ORDER — ONDANSETRON HCL 4 MG/2ML IJ SOLN: 4.0000 mg | Freq: Once | INTRAMUSCULAR | Status: DC | PRN

## 2021-12-20 MED ORDER — KETOROLAC TROMETHAMINE 30 MG/ML IJ SOLN
INTRAMUSCULAR | Status: DC | PRN
  Administered 2021-12-20: 30 mg via INTRAVENOUS

## 2021-12-20 MED ORDER — MIDAZOLAM HCL 2 MG/2ML IJ SOLN
INTRAMUSCULAR | Status: AC
Start: 2021-12-20 — End: ?
  Filled 2021-12-20: qty 2

## 2021-12-20 MED ORDER — ONDANSETRON HCL 4 MG/2ML IJ SOLN
INTRAMUSCULAR | Status: AC
  Filled 2021-12-20: qty 2

## 2021-12-20 MED ORDER — MISOPROSTOL 100 MCG PO TABS
ORAL_TABLET | ORAL | Status: DC | PRN
  Administered 2021-12-20: 800 ug

## 2021-12-20 MED ORDER — LIDOCAINE 2% (20 MG/ML) 5 ML SYRINGE
INTRAMUSCULAR | Status: DC | PRN
  Administered 2021-12-20: 60 mg via INTRAVENOUS

## 2021-12-20 MED ORDER — PROPOFOL 10 MG/ML IV BOLUS
INTRAVENOUS | Status: DC | PRN
  Administered 2021-12-20: 50 mg via INTRAVENOUS
  Administered 2021-12-20: 150 mg via INTRAVENOUS

## 2021-12-20 MED ORDER — MIDAZOLAM HCL 5 MG/5ML IJ SOLN
INTRAMUSCULAR | Status: DC | PRN
  Administered 2021-12-20: 2 mg via INTRAVENOUS

## 2021-12-20 MED ORDER — LACTATED RINGERS IV SOLN
INTRAVENOUS | Status: DC
  Administered 2021-12-20: 1000 mL via INTRAVENOUS

## 2021-12-20 MED ORDER — KETOROLAC TROMETHAMINE 30 MG/ML IJ SOLN
INTRAMUSCULAR | Status: AC
  Filled 2021-12-20: qty 1

## 2021-12-20 MED ORDER — FENTANYL CITRATE (PF) 100 MCG/2ML IJ SOLN
INTRAMUSCULAR | Status: AC
Start: 2021-12-20 — End: ?
  Filled 2021-12-20: qty 2

## 2021-12-20 MED ORDER — ONDANSETRON HCL 4 MG/2ML IJ SOLN
INTRAMUSCULAR | Status: DC | PRN
  Administered 2021-12-20: 4 mg via INTRAVENOUS

## 2021-12-20 MED ORDER — METHYLERGONOVINE MALEATE 0.2 MG/ML IJ SOLN
0.2000 mg | Freq: Once | INTRAMUSCULAR | Status: AC
Start: 2021-12-20 — End: 2021-12-20
  Administered 2021-12-20: 0.2 mg via INTRAMUSCULAR

## 2021-12-20 MED ORDER — DEXAMETHASONE SODIUM PHOSPHATE 10 MG/ML IJ SOLN
INTRAMUSCULAR | Status: DC | PRN
  Administered 2021-12-20: 10 mg via INTRAVENOUS

## 2021-12-20 MED ORDER — LIDOCAINE HCL (PF) 2 % IJ SOLN
INTRAMUSCULAR | Status: AC
  Filled 2021-12-20: qty 5

## 2021-12-20 MED ORDER — FENTANYL CITRATE (PF) 100 MCG/2ML IJ SOLN
INTRAMUSCULAR | Status: DC | PRN
  Administered 2021-12-20: 50 ug via INTRAVENOUS

## 2021-12-20 MED ORDER — SODIUM CHLORIDE 0.9 % IR SOLN
Status: DC | PRN
  Administered 2021-12-20: 12000 mL
  Administered 2021-12-20: 500 mL

## 2021-12-20 MED ORDER — DEXAMETHASONE SODIUM PHOSPHATE 10 MG/ML IJ SOLN
INTRAMUSCULAR | Status: AC
  Filled 2021-12-20: qty 1

## 2021-12-20 MED ORDER — METHYLERGONOVINE MALEATE 0.2 MG/ML IJ SOLN
INTRAMUSCULAR | Status: AC
  Filled 2021-12-20: qty 1

## 2021-12-20 MED ORDER — FENTANYL CITRATE (PF) 100 MCG/2ML IJ SOLN: 25.0000 ug | INTRAMUSCULAR | Status: DC | PRN

## 2021-12-20 SURGICAL SUPPLY — 30 items
CATH ROBINSON RED A/P 16FR (CATHETERS) ×2 IMPLANT
DEVICE MYOSURE LITE (MISCELLANEOUS) ×1 IMPLANT
DEVICE MYOSURE REACH (MISCELLANEOUS) IMPLANT
DILATOR CANAL MILEX (MISCELLANEOUS) IMPLANT
DRSG TELFA 3X8 NADH (GAUZE/BANDAGES/DRESSINGS) ×3 IMPLANT
ELECT REM PT RETURN 9FT ADLT (ELECTROSURGICAL)
ELECTRODE REM PT RTRN 9FT ADLT (ELECTROSURGICAL) IMPLANT
GAUZE 4X4 16PLY ~~LOC~~+RFID DBL (SPONGE) ×3 IMPLANT
GLOVE BIOGEL PI IND STRL 7.0 (GLOVE) ×2 IMPLANT
GLOVE BIOGEL PI INDICATOR 7.0 (GLOVE) ×1
GLOVE ECLIPSE 6.5 STRL STRAW (GLOVE) ×3 IMPLANT
GOWN STRL REUS W/TWL LRG LVL3 (GOWN DISPOSABLE) ×3 IMPLANT
HOSE CONNECTING 18IN BERKELEY (TUBING) ×1 IMPLANT
IV NS IRRIG 3000ML ARTHROMATIC (IV SOLUTION) ×7 IMPLANT
KIT BERKELEY 1ST TRIMESTER 3/8 (MISCELLANEOUS) ×1 IMPLANT
KIT PROCEDURE FLUENT (KITS) ×3 IMPLANT
KIT TURNOVER CYSTO (KITS) ×3 IMPLANT
MYOSURE XL FIBROID (MISCELLANEOUS)
NS IRRIG 500ML POUR BTL (IV SOLUTION) ×3 IMPLANT
PACK VAGINAL MINOR WOMEN LF (CUSTOM PROCEDURE TRAY) ×3 IMPLANT
PAD DRESSING TELFA 3X8 NADH (GAUZE/BANDAGES/DRESSINGS) ×2 IMPLANT
PAD OB MATERNITY 4.3X12.25 (PERSONAL CARE ITEMS) ×3 IMPLANT
PAD PREP 24X48 CUFFED NSTRL (MISCELLANEOUS) ×3 IMPLANT
SEAL CERVICAL OMNI LOK (ABLATOR) IMPLANT
SEAL ROD LENS SCOPE MYOSURE (ABLATOR) ×3 IMPLANT
SET BERKELEY SUCTION TUBING (SUCTIONS) ×1 IMPLANT
SYSTEM TISS REMOVAL MYOSURE XL (MISCELLANEOUS) IMPLANT
TOWEL OR 17X26 10 PK STRL BLUE (TOWEL DISPOSABLE) ×3 IMPLANT
VACURETTE 7MM CVD STRL WRAP (CANNULA) ×1 IMPLANT
WATER STERILE IRR 500ML POUR (IV SOLUTION) ×3 IMPLANT

## 2021-12-20 NOTE — Anesthesia Procedure Notes (Signed)
Procedure Name: LMA Insertion ?Date/Time: 12/20/2021 9:15 AM ?Performed by: Briant Sites, CRNA ?Pre-anesthesia Checklist: Patient identified, Emergency Drugs available, Suction available and Patient being monitored ?Patient Re-evaluated:Patient Re-evaluated prior to induction ?Oxygen Delivery Method: Circle system utilized ?Preoxygenation: Pre-oxygenation with 100% oxygen ?Induction Type: IV induction ?Ventilation: Mask ventilation without difficulty ?LMA: LMA inserted ?LMA Size: 4.0 ?Number of attempts: 1 ?Airway Equipment and Method: Bite block ?Placement Confirmation: positive ETCO2 ?Tube secured with: Tape ?Dental Injury: Teeth and Oropharynx as per pre-operative assessment  ? ? ? ? ?

## 2021-12-20 NOTE — Transfer of Care (Signed)
Immediate Anesthesia Transfer of Care Note ? ?Patient: Felicia Baxter ? ?Procedure(s) Performed: OPERATIVE ULTRASOUND ?SUCTION DILATATION AND CURETTAGE ?HYSTEROSCOPY WITH MYOSURE ? ?Patient Location: PACU ? ?Anesthesia Type:General ? ?Level of Consciousness: drowsy ? ?Airway & Oxygen Therapy: Patient Spontanous Breathing and Patient connected to nasal cannula oxygen ? ?Post-op Assessment: Report given to RN ? ?Post vital signs: Reviewed and stable ? ?Last Vitals:  ?Vitals Value Taken Time  ?BP 113/81   ?Temp    ?Pulse 74 12/20/21 1016  ?Resp    ?SpO2 100 % 12/20/21 1016  ?Vitals shown include unvalidated device data. ? ?Last Pain:  ?Vitals:  ? 12/20/21 0742  ?TempSrc: Oral  ?PainSc: 0-No pain  ?   ? ?Patients Stated Pain Goal: 4 (12/20/21 9924) ? ?Complications: No notable events documented. ?

## 2021-12-20 NOTE — Anesthesia Preprocedure Evaluation (Signed)
Anesthesia Evaluation  ?Patient identified by MRN, date of birth, ID band ?Patient awake ? ? ? ?Reviewed: ?Allergy & Precautions, NPO status , Patient's Chart, lab work & pertinent test results, reviewed documented beta blocker date and time  ? ?Airway ?Mallampati: II ? ?TM Distance: >3 FB ?Neck ROM: Full ? ? ? Dental ?no notable dental hx. ?(+) Teeth Intact, Dental Advisory Given ?  ?Pulmonary ?neg pulmonary ROS,  ?  ?Pulmonary exam normal ?breath sounds clear to auscultation ? ? ? ? ? ? Cardiovascular ?negative cardio ROS ?Normal cardiovascular exam ?Rhythm:Regular Rate:Normal ? ? ?  ?Neuro/Psych ?PSYCHIATRIC DISORDERS Anxiety Depression negative neurological ROS ?   ? GI/Hepatic ?Neg liver ROS, IBS ?  ?Endo/Other  ?negative endocrine ROS ? Renal/GU ?negative Renal ROS  ?negative genitourinary ?  ?Musculoskeletal ?negative musculoskeletal ROS ?(+)  ? Abdominal ?  ?Peds ? Hematology ? ?(+) Blood dyscrasia, anemia ,   ?Anesthesia Other Findings ? ? Reproductive/Obstetrics ?AUB ?Endometrial thickening ? ?  ? ? ? ? ? ? ? ? ? ? ? ? ? ?  ?  ? ? ? ? ? ? ? ? ?Anesthesia Physical ?Anesthesia Plan ? ?ASA: 2 ? ?Anesthesia Plan: General  ? ?Post-op Pain Management:   ? ?Induction: Intravenous ? ?PONV Risk Score and Plan: 4 or greater and Treatment may vary due to age or medical condition and Ondansetron ? ?Airway Management Planned: LMA ? ?Additional Equipment:  ? ?Intra-op Plan:  ? ?Post-operative Plan: Extubation in OR ? ?Informed Consent: I have reviewed the patients History and Physical, chart, labs and discussed the procedure including the risks, benefits and alternatives for the proposed anesthesia with the patient or authorized representative who has indicated his/her understanding and acceptance.  ? ? ? ?Dental advisory given ? ?Plan Discussed with: CRNA and Anesthesiologist ? ?Anesthesia Plan Comments:   ? ? ? ? ? ? ?Anesthesia Quick Evaluation ? ?

## 2021-12-20 NOTE — Anesthesia Postprocedure Evaluation (Signed)
Anesthesia Post Note ? ?Patient: Felicia Baxter ? ?Procedure(s) Performed: OPERATIVE ULTRASOUND ?SUCTION DILATATION AND CURETTAGE ?HYSTEROSCOPY WITH MYOSURE ? ?  ? ?Patient location during evaluation: PACU ?Anesthesia Type: General ?Level of consciousness: awake and alert and oriented ?Pain management: pain level controlled ?Vital Signs Assessment: post-procedure vital signs reviewed and stable ?Respiratory status: spontaneous breathing, nonlabored ventilation and respiratory function stable ?Cardiovascular status: blood pressure returned to baseline and stable ?Postop Assessment: no apparent nausea or vomiting ?Anesthetic complications: no ? ? ?No notable events documented. ? ?Last Vitals:  ?Vitals:  ? 12/20/21 1030 12/20/21 1045  ?BP: 102/72 108/84  ?Pulse: 71 73  ?Resp: 12 11  ?Temp:  36.4 ?C  ?SpO2: 100% 96%  ?  ?Last Pain:  ?Vitals:  ? 12/20/21 1045  ?TempSrc:   ?PainSc: 0-No pain  ? ? ?  ?  ?  ?  ?  ?  ? ?[name] ? ? ? ? ?

## 2021-12-20 NOTE — Brief Op Note (Signed)
12/20/2021 ? ?10:24 AM ? ?PATIENT:  Tequila Rottmann  41 y.o. female ? ?PRE-OPERATIVE DIAGNOSIS:  Abnormal uterine bleeding postpartum, endometrial thickening on ultrasound ? ?POST-OPERATIVE DIAGNOSIS:  Abnormal uterine bleeding postpartum, endometrial thickening on ultrasound ? ?PROCEDURE:  Procedure(s): ?OPERATIVE ULTRASOUND (N/A) ?SUCTION DILATATION AND CURETTAGE (N/A) ?HYSTEROSCOPY WITH MYOSURE ? ?SURGEON:  Surgeon(s) and Role: ?   Maxie Better, MD - Primary ? ?PHYSICIAN ASSISTANT:  ? ?ASSISTANTS: none  ? ?ANESTHESIA:   general ? ?EBL:  100 mL  ? ?BLOOD ADMINISTERED:none ? ?DRAINS: none  ? ?LOCAL MEDICATIONS USED:  NONE ? ?SPECIMEN:  Source of Specimen:  endometrial curetting ? ?DISPOSITION OF SPECIMEN:  PATHOLOGY ? ?COUNTS:  YES ? ?TOURNIQUET:  * No tourniquets in log * ? ?DICTATION: .Other Dictation: Dictation Number [id number] ? ?PLAN OF CARE: Discharge to home after PACU ? ?PATIENT DISPOSITION:  PACU - hemodynamically stable. ?  ?Delay start of Pharmacological VTE agent (>24hrs) due to surgical blood loss or risk of bleeding: no ? ?

## 2021-12-20 NOTE — Interval H&P Note (Signed)
History and Physical Interval Note: ? ?12/20/2021 ?9:02 AM ? ?Felicia Baxter  has presented today for surgery, with the diagnosis of Abnormal uterine bleeding, endometrial thickening on sonogram.  The various methods of treatment have been discussed with the patient and family. After consideration of risks, benefits and other options for treatment, the patient has consented to  Procedure(s): ?OPERATIVE ULTRASOUND (N/A) ?DILATATION AND CURETTAGE (N/A) ?POSSIBLE DILATATION & CURETTAGE/HYSTEROSCOPY WITH MYOSURE (N/A) as a surgical intervention.  The patient's history has been reviewed, patient examined, no change in status, stable for surgery.  I have reviewed the patient's chart and labs.  Questions were answered to the patient's satisfaction.   ? ? ?Alane Hanssen A Ethal Gotay ? ? ?

## 2021-12-20 NOTE — Discharge Instructions (Addendum)
CALL  IF TEMP>100.4, NOTHING PER VAGINA X 2 WK, CALL IF SOAKING A MAXI  PAD EVERY HOUR OR MORE FREQUENTLY  ? ? ?No ibuprofen, Advil, Aleve, Motrin, ketorolac, meloxicam, naproxen, or other NSAIDS until after 3:30pm today if needed for pain.  ? ?DISCHARGE INSTRUCTIONS: HYSTEROSCOPY ?The following instructions have been prepared to help you care for yourself upon your return home. ? ?May take stool softner while taking narcotic pain medication to prevent constipation.  Drink plenty of water. ? ?Personal hygiene: ? Use sanitary pads for vaginal drainage, not tampons. ? Shower the day after your procedure. ? NO tub baths, pools or Jacuzzis for 2-3 weeks. ? Wipe front to back after using the bathroom. ? ?Activity and limitations: ? Do NOT drive or operate any equipment for 24 hours. The effects of anesthesia are still present ?and drowsiness may result. ? Do NOT rest in bed all day. ? Walking is encouraged. ? Walk up and down stairs slowly. ? You may resume your normal activity in one to two days or as indicated by your physician. ?Sexual activity: NO intercourse for at least 2 weeks after the procedure, or as indicated by your ?Doctor. ? ?Diet: Eat a light meal as desired this evening. You may resume your usual diet tomorrow. ? ?Return to Work: You may resume your work activities in one to two days or as indicated by your ?Doctor. ? ?What to expect after your surgery: Expect to have vaginal bleeding/discharge for 2-3 days and ?spotting for up to 10 days. It is not unusual to have soreness for up to 1-2 weeks. You may have a ?slight burning sensation when you urinate for the first day. Mild cramps may continue for a couple of ?days. You may have a regular period in 2-6 weeks. ? ?Call your doctor for any of the following: ? Excessive vaginal bleeding or clotting, saturating and changing one pad every hour. ? Inability to urinate 6 hours after discharge from hospital. ? Pain not relieved by pain medication. ? Fever of  100.4? F or greater. ? Unusual vaginal discharge or odor. ? ? ? ?Post Anesthesia Home Care Instructions ? ?Activity: ?Get plenty of rest for the remainder of the day. A responsible individual must stay with you for 24 hours following the procedure.  ?For the next 24 hours, DO NOT: ?-Drive a car ?-Paediatric nurse ?-Drink alcoholic beverages ?-Take any medication unless instructed by your physician ?-Make any legal decisions or sign important papers. ? ?Meals: ?Start with liquid foods such as gelatin or soup. Progress to regular foods as tolerated. Avoid greasy, spicy, heavy foods. If nausea and/or vomiting occur, drink only clear liquids until the nausea and/or vomiting subsides. Call your physician if vomiting continues. ? ?Special Instructions/Symptoms: ?Your throat may feel dry or sore from the anesthesia or the breathing tube placed in your throat during surgery. If this causes discomfort, gargle with warm salt water. The discomfort should disappear within 24 hours. ?

## 2021-12-20 NOTE — H&P (Signed)
Felicia Baxter is an 41 y.o. female. Felicia Baxter presents for ultrasound guided suction dilation and curettage possible dx hysteroscopy due to endometrial thickening noted on sonogram done for persistent vaginal bleeding postpartum, pt had a C/S 1/30 with pathology revealing twin placenta fused intact.  ? ?Pertinent Gynecological History: ?Menses:  postpartum ?Bleeding: dysfunctional uterine bleeding ?Contraception: none ?DES exposure: denies ?Blood transfusions: none ?Sexually transmitted diseases: no past history ?Previous GYN Procedures:  c/s   ?Last mammogram:  unknown  Date: n/a ?Last pap: normal Date: 2022 ?OB History: G3, P1112  ? ?Menstrual History: ?Menarche age: n/a ?No LMP recorded (lmp unknown). (Menstrual status: Irregular Periods). ?  ? ?Past Medical History:  ?Diagnosis Date  ? Depression   ? GAD (generalized anxiety disorder)   ? History of abnormal cervical Pap smear   ? 2002  and 2006  ? History of COVID-19 11/2020  ? per pt moderate symptoms, that resolved  ? IBS (irritable bowel syndrome)   ? Infertility, female   ? Postpartum bleeding   ? s/p C/S w/ twins gestation 10-06-2021  ? Thickened endometrium   ? ? ?Past Surgical History:  ?Procedure Laterality Date  ? AUGMENTATION MAMMAPLASTY Bilateral 2008  ? implants  ? CESAREAN SECTION  01/07/1999  ? CESAREAN SECTION MULTI-GESTATIONAL N/A 10/06/2021  ? Procedure: CESAREAN SECTION MULTI-GESTATIONAL;  Surgeon: Maxie Better, MD;  Location: [facility] LD ORS;  Service: Obstetrics;  Laterality: N/A;  ? CYST EXCISION    ? 2006  seb. cyst upper back;  and 2010 left buttock  ? LAPAROSCOPIC CHOLECYSTECTOMY  08/1999  ? ? ?Family History  ?Problem Relation Age of Onset  ? Cancer Father 65  ?     Prostate   ? Hypertension Maternal Grandmother   ? Cancer Maternal Grandfather   ? ? ?Social History:  reports that she has never smoked. She has never used smokeless tobacco. She reports current alcohol use. She reports that she does not use drugs. ? ?Allergies:   ?Allergies  ?Allergen Reactions  ? Amitiza [Lubiprostone] Other (See Comments)  ?  Severe abdominal cramping  ? Darvon [Propoxyphene] Nausea And Vomiting  ? Oxycodone Nausea And Vomiting  ? ? ?No medications prior to admission.  ? ? ?Review of Systems  ?All other systems reviewed and are negative. ? ?Height 5\' 5"  (1.651 m), weight 61.7 kg, currently breastfeeding. ?Physical Exam ?Constitutional:   ?   Appearance: Normal appearance.  ?Eyes:  ?   Extraocular Movements: Extraocular movements intact.  ?Cardiovascular:  ?   Rate and Rhythm: Regular rhythm.  ?Pulmonary:  ?   Breath sounds: Normal breath sounds.  ?Abdominal:  ?   Palpations: Abdomen is soft.  ?Genitourinary: ?   General: Normal vulva.  ?Musculoskeletal:     ?   General: Normal range of motion.  ?   Cervical back: Neck supple.  ?Neurological:  ?   Mental Status: She is alert and oriented to person, place, and time.  ?Psychiatric:     ?   Mood and Affect: Mood normal.     ?   Behavior: Behavior normal.  ? ? ?No results found for this or any previous visit (from the past 24 hour(s)). ? ?No results found. ? ?Assessment/Plan: ?AUB ? Retained placental tissue ?Endometrial thickening on sonogram ?P) u/s guided suction D&C, possible dx hysteroscopy ?Procedure explained. Risk of surgery reviewed including  ?Infection, bleeding, injury to surrounding organ structure, uterine perforation, fluid overload and its mgmt, possible need for ?Blood transfusion and its risk. All ?  answered ? ?Felicia Baxter ?12/20/2021, 6:20 AM ? ?

## 2021-12-20 NOTE — OR Nursing (Signed)
1225 discharge to home, skin warm dry pink resp even and unlabored scant vaginal drainage on peripad. Darin Engels rn ?

## 2021-12-21 NOTE — Op Note (Signed)
NAME: Felicia Baxter, Felicia Baxter ?MEDICAL RECORD NO: [id number] ?ACCOUNT NO: [id number] ?DATE OF BIRTH: 1981/02/27 ?FACILITY: [facility] ?LOCATION: [facility] ?PHYSICIAN: Carmela Piechowski A. Garwin Brothers, MD ? ?Operative Report  ? ?DATE OF PROCEDURE: 12/20/2021 ? ?PREOPERATIVE DIAGNOSES:  Abnormal uterine bleeding, question retained placental tissue, endometrial thickening on ultrasound. ? ?PROCEDURES:  Ultrasound-guided suction dilation and curettage; diagnostic hysteroscopy; hysteroscopic resection of endometrial thickening, dilation and curettage ? ?POSTOPERATIVE DIAGNOSES:  Abnormal uterine bleeding, possible retained placental tissue, endometrial thickening on ultrasound. ? ?ANESTHESIA:  General. ? ?SURGEON:  Karston Hyland A. Garwin Brothers, MD ? ?ASSISTANT:  None. ? ?DESCRIPTION OF PROCEDURE:  Under adequate general anesthesia, the patient was placed in the dorsal lithotomy position.  She was sterilely prepped and draped in the usual fashion.  The bladder was not catheterized for planned use of the ultrasound.  The bivalve speculum was placed in the vagina.  The patient had a very erythematous vaginal mucosa consistent with atrophic vaginitis.  A single-tooth tenaculum was placed on the anterior lip of the cervix.   ?The cervix was dilated up to #19 [facility] dilator and under ultrasound guidance, the endometrium was thickened and indiscrete.  A #7 mm curved suction cannula was introduced into the uterine cavity under direct visualization using ultrasound.  Moderate amount of tissue  suggestive of product of conception was obtained however the ultrasound could not show a defined endometrial stripe.  The suction cannula was removed and the cavity was gently curetted, however, again, the endometrial stripe was not defined to determine if all tissue was removed.  Therefore, decision was then made  ?to proceed with a diagnostic hysteroscopy.  The ultrasound portion of the procedure was then terminated.  A diagnostic hysteroscopy was then introduced into the  uterine cavity, at which point there was evidence on the fundal, lateral and posterior aspect of the endometrial wall of some yellow frond like thickened tissue, which using the Lite resectoscope was then resected and the remainder of the endometrial cavity was resected as well. Increased vaginal Bleeding was noted.  When the procedure was felt to be complete, all instruments were then removed from the vagina.  The specimens labelled product of conception as well as the endometrial curetting were sent separately.  Estimated blood loss was 150 mL.  Fluid deficit was 150 ml.  Complication was none.  The patient tolerated the procedure well and was transferred to recovery room in stable condition. ? ? ?NIK ?D: 12/20/2021 12:03:51 pm T: 12/21/2021 12:47:00 am  ?JOB: [id number]  ?

## 2021-12-23 ENCOUNTER — Encounter (HOSPITAL_BASED_OUTPATIENT_CLINIC_OR_DEPARTMENT_OTHER): Payer: Self-pay | Admitting: Obstetrics and Gynecology

## 2021-12-23 LAB — SURGICAL PATHOLOGY
